# Patient Record
Sex: Male | Born: 1950 | Race: White | Hispanic: No | Marital: Married | Smoking: Former smoker
Health system: Southern US, Community
[De-identification: ages and names within clinical notes are randomized; demographics above are authoritative.]

## PROBLEM LIST (undated history)

## (undated) DIAGNOSIS — I10 Essential (primary) hypertension: Secondary | ICD-10-CM

## (undated) DIAGNOSIS — G8929 Other chronic pain: Secondary | ICD-10-CM

## (undated) DIAGNOSIS — E785 Hyperlipidemia, unspecified: Secondary | ICD-10-CM

## (undated) DIAGNOSIS — M25569 Pain in unspecified knee: Secondary | ICD-10-CM

## (undated) DIAGNOSIS — G4730 Sleep apnea, unspecified: Secondary | ICD-10-CM

## (undated) DIAGNOSIS — R002 Palpitations: Secondary | ICD-10-CM

## (undated) HISTORY — DX: Sleep apnea, unspecified: G47.30

## (undated) HISTORY — DX: Pain in unspecified knee: M25.569

## (undated) HISTORY — DX: Essential (primary) hypertension: I10

## (undated) HISTORY — DX: Other chronic pain: G89.29

## (undated) HISTORY — PX: ANTERIOR CRUCIATE LIGAMENT REPAIR: SHX115

## (undated) HISTORY — DX: Hyperlipidemia, unspecified: E78.5

---

## 2000-06-16 ENCOUNTER — Ambulatory Visit (HOSPITAL_COMMUNITY): Admission: RE | Admit: 2000-06-16 | Discharge: 2000-06-16 | Payer: Self-pay | Admitting: Emergency Medicine

## 2002-08-14 ENCOUNTER — Encounter: Payer: Self-pay | Admitting: Internal Medicine

## 2002-08-14 ENCOUNTER — Ambulatory Visit (HOSPITAL_COMMUNITY): Admission: RE | Admit: 2002-08-14 | Discharge: 2002-08-14 | Payer: Self-pay | Admitting: Internal Medicine

## 2002-08-31 ENCOUNTER — Encounter: Admission: RE | Admit: 2002-08-31 | Discharge: 2002-09-28 | Payer: Self-pay | Admitting: Internal Medicine

## 2003-12-07 ENCOUNTER — Ambulatory Visit (HOSPITAL_COMMUNITY): Admission: RE | Admit: 2003-12-07 | Discharge: 2003-12-07 | Payer: Self-pay | Admitting: Orthopedic Surgery

## 2006-01-01 ENCOUNTER — Ambulatory Visit (HOSPITAL_BASED_OUTPATIENT_CLINIC_OR_DEPARTMENT_OTHER): Admission: RE | Admit: 2006-01-01 | Discharge: 2006-01-01 | Payer: Self-pay | Admitting: Internal Medicine

## 2006-01-03 ENCOUNTER — Ambulatory Visit: Payer: Self-pay | Admitting: Internal Medicine

## 2006-03-30 ENCOUNTER — Ambulatory Visit (HOSPITAL_COMMUNITY): Admission: RE | Admit: 2006-03-30 | Discharge: 2006-03-30 | Payer: Self-pay | Admitting: Gastroenterology

## 2006-04-06 ENCOUNTER — Ambulatory Visit: Payer: Self-pay | Admitting: Internal Medicine

## 2006-05-20 ENCOUNTER — Ambulatory Visit: Payer: Self-pay | Admitting: Internal Medicine

## 2006-07-20 ENCOUNTER — Ambulatory Visit: Payer: Self-pay | Admitting: Internal Medicine

## 2007-08-04 DIAGNOSIS — J449 Chronic obstructive pulmonary disease, unspecified: Secondary | ICD-10-CM

## 2007-08-04 DIAGNOSIS — G4733 Obstructive sleep apnea (adult) (pediatric): Secondary | ICD-10-CM

## 2007-08-04 DIAGNOSIS — J4489 Other specified chronic obstructive pulmonary disease: Secondary | ICD-10-CM | POA: Insufficient documentation

## 2007-08-08 ENCOUNTER — Encounter: Payer: Self-pay | Admitting: Internal Medicine

## 2007-08-08 ENCOUNTER — Ambulatory Visit (HOSPITAL_BASED_OUTPATIENT_CLINIC_OR_DEPARTMENT_OTHER): Admission: RE | Admit: 2007-08-08 | Discharge: 2007-08-08 | Payer: Self-pay | Admitting: Internal Medicine

## 2007-08-14 ENCOUNTER — Ambulatory Visit: Payer: Self-pay | Admitting: Internal Medicine

## 2007-09-07 ENCOUNTER — Telehealth: Payer: Self-pay | Admitting: Internal Medicine

## 2007-09-14 ENCOUNTER — Telehealth: Payer: Self-pay | Admitting: Internal Medicine

## 2008-07-09 ENCOUNTER — Ambulatory Visit: Payer: Self-pay | Admitting: Sports Medicine

## 2008-07-09 DIAGNOSIS — M79609 Pain in unspecified limb: Secondary | ICD-10-CM | POA: Insufficient documentation

## 2009-04-03 ENCOUNTER — Ambulatory Visit: Payer: Self-pay | Admitting: Sports Medicine

## 2009-04-03 DIAGNOSIS — M25559 Pain in unspecified hip: Secondary | ICD-10-CM | POA: Insufficient documentation

## 2009-04-03 DIAGNOSIS — M1710 Unilateral primary osteoarthritis, unspecified knee: Secondary | ICD-10-CM

## 2009-09-08 ENCOUNTER — Emergency Department (HOSPITAL_COMMUNITY): Admission: EM | Admit: 2009-09-08 | Discharge: 2009-09-08 | Payer: Self-pay | Admitting: Emergency Medicine

## 2009-09-08 DIAGNOSIS — I499 Cardiac arrhythmia, unspecified: Secondary | ICD-10-CM | POA: Insufficient documentation

## 2009-09-27 ENCOUNTER — Ambulatory Visit: Payer: Self-pay | Admitting: Cardiology

## 2009-09-27 DIAGNOSIS — E785 Hyperlipidemia, unspecified: Secondary | ICD-10-CM

## 2009-09-27 DIAGNOSIS — I10 Essential (primary) hypertension: Secondary | ICD-10-CM | POA: Insufficient documentation

## 2009-09-29 LAB — CONVERTED CEMR LAB: TSH: 0.54 microintl units/mL (ref 0.35–5.50)

## 2010-01-01 ENCOUNTER — Ambulatory Visit (HOSPITAL_COMMUNITY): Admission: RE | Admit: 2010-01-01 | Discharge: 2010-01-01 | Payer: Self-pay | Admitting: Orthopedic Surgery

## 2010-10-21 NOTE — Assessment & Plan Note (Signed)
Summary: np6/palps/rapid & irregular/jml   Visit Type:  Follow-up Primary Provider:  Dr. Waynard Edwards  CC:  Arrhythmia.  History of Present Illness: The patient is a Teacher, early years/pre at [employer].  He presents for evaluation of palpitations. This happened on December 19. It actually woke him from his sleep. He felt that his heart was racing and that his pulse was very "thready". He was unable to obtain a blood pressure on his home blood pressure cuff. He was unable to count the heart rate. He presented to the emergency room but by the time he got there the arrhythmia had abated. It persisted for 30-45 minutes. Since then he has had no further tachycardia palpitations. With this he did not have presyncope or syncope. He did not have chest pressure, neck or arm discomfort. He had no shortness of breath. He was not having PND or orthopnea. In the ER he ruled out for myocardial infarction. BNP, basic metabolic profile and CBC were normal. Chest x-ray was normal. I reviewed these results.  He is very active. He exercises routinely a rope quickly. With this he has no cardiovascular symptoms. He had a negative exercise treadmill test at age 30.   Current Medications (verified): 1)  Lisinopril 40 Mg  Tabs (Lisinopril) .... Take 1 Tablet By Mouth Once A Day 2)  Zocor 20 Mg  Tabs (Simvastatin) .... Take 1 Tablet By Mouth Once A Day 3)  Doxycycline Hyclate 100 Mg  Tabs (Doxycycline Hyclate) .... Take 1 Tablet By Mouth Once A Day 4)  Aspirin 81 Mg  Tabs (Aspirin) .Marland Kitchen.. 1 By Mouth Daily 5)  Hydrochlorothiazide 12.5 Mg Caps (Hydrochlorothiazide) .Marland Kitchen.. 1 By Mouth Daily 6)  Flonase 50 Mcg/act Susp (Fluticasone Propionate) .... Daily 7)  Fish Oil   Oil (Fish Oil) .Marland Kitchen.. 1 By Mouth Daily 8)  Finacea 15 % Gel (Azelaic Acid) .... Daily  Allergies (verified): No Known Drug Allergies  Past History:  Past Medical History: Chronic Bilateral Knee Pain (L>R) Sleep apnea (treated with a mouthguard) Hypertension x6  years Hyperlipidemia x6 years  Past Surgical History: ACL repair  Family History: The patient's father died at age 74 but he is not clear of the cause. His mother died of ovarian cancer at age 74. He has 2 sisters alive and healthy.  Social History: He he is married and has 2 children. He smoked briefly for about 6 years quitting in 1974.  He does drink 3-4 cups of coffee per day.  Review of Systems       Positive for occasional reflux, joint pains. Otherwise negative for all other systems.  Vital Signs:  Patient profile:   60 year old male Height:      71 inches Weight:      159 pounds BMI:     22.26 Pulse rate:   84 / minute Resp:     16 per minute BP sitting:   122 / 78  (right arm)  Vitals Entered By: Marrion Coy, CNA (September 27, 2009 9:36 AM)  Physical Exam  General:  Well developed, well nourished, in no acute distress. Head:  normocephalic and atraumatic Eyes:  PERRLA/EOM intact; conjunctiva and lids normal. Mouth:  Teeth, gums and palate normal. Oral mucosa normal. Neck:  Neck supple, no JVD. No masses, thyromegaly or abnormal cervical nodes. Chest Wall:  no deformities or breast masses noted Lungs:  Clear bilaterally to auscultation and percussion. Abdomen:  Bowel sounds positive; abdomen soft and non-tender without masses, organomegaly, or hernias noted. No  hepatosplenomegaly. Msk:  Back normal, normal gait. Muscle strength and tone normal. Extremities:  No clubbing or cyanosis. Neurologic:  Alert and oriented x 3. Skin:  Intact without lesions or rashes. Cervical Nodes:  no significant adenopathy Axillary Nodes:  no significant adenopathy Inguinal Nodes:  no significant adenopathy Psych:  Normal affect.   Detailed Cardiovascular Exam  Neck    Carotids: Carotids full and equal bilaterally without bruits.      Neck Veins: Normal, no JVD.    Heart    Inspection: no deformities or lifts noted.      Palpation: normal PMI with no thrills palpable.       Auscultation: regular rate and rhythm, S1, S2 without murmurs, rubs, gallops, or clicks.    Vascular    Abdominal Aorta: no palpable masses, pulsations, or audible bruits.      Femoral Pulses: normal femoral pulses bilaterally.      Pedal Pulses: normal pedal pulses bilaterally.      Radial Pulses: normal radial pulses bilaterally.      Peripheral Circulation: no clubbing, cyanosis, or edema noted with normal capillary refill.     Impression & Recommendations:  Problem # 1:  CARDIAC ARRHYTHMIA (ICD-427.9) The patient had one episode of a sustained arrhythmia. The mechanism of this is not clear. He has a perfectly normal physical exam and EKG. He has had no symptoms since then. I will check a TSH. I do not think further evaluation is warranted. However, we discussed at length mechanisms to try to get a rhythm strip if this ever happens again. If it happens with any frequency he will call me and we will consider monitoring with an event monitor or Holter.  Problem # 2:  ESSENTIAL HYPERTENSION, BENIGN (ICD-401.1) His blood pressure is controlled and he continues to meds as listed.  Other Orders: TLB-TSH (Thyroid Stimulating Hormone) (84443-TSH) EKG w/ Interpretation (93000)  CHF Assessment/Plan:      The patient's current weight is 159 pounds.  His previous weight was 162 pounds.    Patient Instructions: 1)  Your physician recommends that you schedule a follow-up appointment as needed 2)  Your physician recommends that you continue on your current medications as directed. Please refer to the Current Medication list given to you today. 3)  You have been diagnosed with atrial fibrillation.  Atrial fibrillation is a condition in which one of the upper chambers of the heart has extra electrical cells causing it to beat very fast.  Please see the handout/brochure given to you today for further information.

## 2010-11-26 ENCOUNTER — Encounter: Payer: Self-pay | Admitting: Cardiology

## 2010-11-26 ENCOUNTER — Other Ambulatory Visit: Payer: Self-pay | Admitting: Cardiology

## 2010-11-26 ENCOUNTER — Ambulatory Visit (INDEPENDENT_AMBULATORY_CARE_PROVIDER_SITE_OTHER): Payer: 59 | Admitting: Cardiology

## 2010-11-26 ENCOUNTER — Encounter (INDEPENDENT_AMBULATORY_CARE_PROVIDER_SITE_OTHER): Payer: 59

## 2010-11-26 DIAGNOSIS — E785 Hyperlipidemia, unspecified: Secondary | ICD-10-CM

## 2010-11-26 DIAGNOSIS — R9431 Abnormal electrocardiogram [ECG] [EKG]: Secondary | ICD-10-CM | POA: Insufficient documentation

## 2010-11-26 DIAGNOSIS — R002 Palpitations: Secondary | ICD-10-CM

## 2010-11-26 DIAGNOSIS — I499 Cardiac arrhythmia, unspecified: Secondary | ICD-10-CM

## 2010-11-26 LAB — BASIC METABOLIC PANEL
BUN: 18 mg/dL (ref 6–23)
CO2: 29 mEq/L (ref 19–32)
Calcium: 9.3 mg/dL (ref 8.4–10.5)
Chloride: 100 mEq/L (ref 96–112)
Creatinine, Ser: 1 mg/dL (ref 0.4–1.5)
GFR: 80.85 mL/min (ref >60.00)
Glucose, Bld: 96 mg/dL (ref 70–99)
Potassium: 4 mEq/L (ref 3.5–5.1)
Sodium: 141 mEq/L (ref 135–145)

## 2010-12-01 ENCOUNTER — Encounter: Payer: Self-pay | Admitting: *Deleted

## 2010-12-02 NOTE — Assessment & Plan Note (Signed)
Summary: palpations/mt   Visit Type:  Follow-up Primary Provider:  Dr. Waynard Edwards  CC:  palpitations.  History of Present Illness: The patient presents for followup of palpitations. This is different than previous complaints. He feels his heart skipping and thinks he might be having PVCs. However, he has not had this recorded. It is happening daily particularly in the mornings.  He will feel it's getting particularly in the morning.  that he cannot bring these on. He denies any chest pressure, neck or arm discomfort. He denies any shortness of breath, PND or orthopnea. He does exercise routinely and denies any of the symptoms. He did cut back his caffeine but still drinks one cup of coffee in the morning. He doesn't think she's had these symptoms in the past.  Current Medications (verified): 1)  Lisinopril 40 Mg  Tabs (Lisinopril) .... Take 1 Tablet By Mouth Once A Day 2)  Zocor 20 Mg  Tabs (Simvastatin) .... Take 1 Tablet By Mouth Once A Day 3)  Doxycycline Hyclate 100 Mg  Tabs (Doxycycline Hyclate) .... Take 1 Tablet By Mouth Once A Day 4)  Aspirin 81 Mg  Tabs (Aspirin) .Marland Kitchen.. 1 By Mouth Daily 5)  Hydrochlorothiazide 12.5 Mg Caps (Hydrochlorothiazide) .Marland Kitchen.. 1 By Mouth Daily 6)  Flonase 50 Mcg/act Susp (Fluticasone Propionate) .... Daily 7)  Fish Oil   Oil (Fish Oil) .Marland Kitchen.. 1 By Mouth Daily 8)  Finacea 15 % Gel (Azelaic Acid) .... Daily  Allergies (verified): No Known Drug Allergies  Past History:  Past Medical History: Chronic Bilateral Knee Pain (L>R) Sleep apnea (treated with a mouthguard) Hypertension  Hyperlipidemia  Review of Systems       As stated in the HPI and negative for all other systems.   Vital Signs:  Patient profile:   60 year old male Height:      71 inches Weight:      151 pounds BMI:     21.14 Pulse rate:   70 / minute Pulse rhythm:   regular BP sitting:   128 / 74  (right arm) Cuff size:   regular  Vitals Entered By: Judithe Modest CMA (November 26, 2010  11:53 AM)  Physical Exam  General:  Well developed, well nourished, in no acute distress. Head:  normocephalic and atraumatic Neck:  Neck supple, no JVD. No masses, thyromegaly or abnormal cervical nodes. Chest Wall:  no deformities or breast masses noted Lungs:  Clear bilaterally to auscultation and percussion. Abdomen:  Bowel sounds positive; abdomen soft and non-tender without masses, organomegaly, or hernias noted. No hepatosplenomegaly. Msk:  Back normal, normal gait. Muscle strength and tone normal. Extremities:  No clubbing or cyanosis. Neurologic:  Alert and oriented x 3. Skin:  Intact without lesions or rashes. Cervical Nodes:  no significant adenopathy Inguinal Nodes:  no significant adenopathy Psych:  Normal affect.   Detailed Cardiovascular Exam  Neck    Carotids: Carotids full and equal bilaterally without bruits.      Neck Veins: Normal, no JVD.    Heart    Inspection: no deformities or lifts noted.      Palpation: normal PMI with no thrills palpable.      Auscultation: regular rate and rhythm, S1, S2 without murmurs, rubs, gallops, or clicks.    Vascular    Abdominal Aorta: no palpable masses, pulsations, or audible bruits.      Femoral Pulses: normal femoral pulses bilaterally.      Pedal Pulses: normal pedal pulses bilaterally.  Radial Pulses: normal radial pulses bilaterally.      Peripheral Circulation: no clubbing, cyanosis, or edema noted with normal capillary refill.     EKG  Procedure date:  11/26/2010  Findings:      Sinus rhythm, rate 70, axis within normal limits, intervals within normal limits, possible left atrial enlargement  Impression & Recommendations:  Problem # 1:  PALPITATIONS (ICD-785.1) I will check electrolytes including magnesium and TSH. I will apply a 24-hour monitor.  He will have an echo. This will need to evaluate the possible left atrial enlargement on EKG  Orders: Echocardiogram (Echo)  Problem # 2:  OBSTRUCTIVE  SLEEP APNEA (ICD-327.23) I will be looking at the echo to see if he has had any evidence of increased pulmonary or RV pressures.  This could be related (though unlikely) to ectopy.  Problem # 3:  ESSENTIAL HYPERTENSION, BENIGN (ICD-401.1) His blood pressure is controlled and he will continue the meds as listed.  Other Orders: TLB-BMP (Basic Metabolic Panel-BMET) (80048-METABOL) TLB-Magnesium (Mg) (83735-MG) TLB-TSH (Thyroid Stimulating Hormone) (84443-TSH) Holter Monitor (Holter Monitor)  Patient Instructions: 1)  Your physician recommends that you schedule a follow-up appointment in: 3 months 2)  Your physician has recommended that you wear a holter monitor.  Holter monitors are medical devices that record the heart's electrical activity. Doctors most often use these monitors to diagnose arrhythmias. Arrhythmias are problems with the speed or rhythm of the heartbeat. The monitor is a small, portable device. You can wear one while you do your normal daily activities. This is usually used to diagnose what is causing palpitations/syncope (passing out). 24 hours holter monitor 3)  Your physician recommends that you return for lab work in: Today BMET, Magnesium TSH.

## 2010-12-04 ENCOUNTER — Ambulatory Visit (HOSPITAL_COMMUNITY): Payer: 59 | Attending: Cardiology

## 2010-12-04 DIAGNOSIS — G4730 Sleep apnea, unspecified: Secondary | ICD-10-CM | POA: Insufficient documentation

## 2010-12-04 DIAGNOSIS — I10 Essential (primary) hypertension: Secondary | ICD-10-CM | POA: Insufficient documentation

## 2010-12-04 DIAGNOSIS — R002 Palpitations: Secondary | ICD-10-CM | POA: Insufficient documentation

## 2010-12-04 DIAGNOSIS — I359 Nonrheumatic aortic valve disorder, unspecified: Secondary | ICD-10-CM | POA: Insufficient documentation

## 2010-12-04 DIAGNOSIS — E785 Hyperlipidemia, unspecified: Secondary | ICD-10-CM | POA: Insufficient documentation

## 2010-12-08 ENCOUNTER — Telehealth: Payer: Self-pay | Admitting: Cardiology

## 2010-12-09 ENCOUNTER — Telehealth: Payer: Self-pay | Admitting: *Deleted

## 2010-12-09 DIAGNOSIS — R002 Palpitations: Secondary | ICD-10-CM

## 2010-12-09 NOTE — Telephone Encounter (Signed)
Called to give pt results of his monitor which demonstrated PVC's with a brief run of bigeminy - Dr Antoine Poche has ordered for the pt to start Metoprolol 12.5 mg po bid.  Left message for pt to call back for results and orders.

## 2010-12-10 MED ORDER — METOPROLOL TARTRATE 25 MG PO TABS: 12.5000 mg | ORAL_TABLET | Freq: Two times a day (BID) | ORAL | Status: DC

## 2010-12-10 NOTE — Telephone Encounter (Signed)
Pt aware of need to start Metoprolol 25 mg 1/2 tablet bid for palps.  He will follow up as ordered and call back if problems or needs prior to then.

## 2010-12-18 NOTE — Progress Notes (Signed)
Summary: Questions about monitor results  left message   Phone Note Call from Patient Call back at Work Phone [phone] Call back at [phone]   Caller: Patient Summary of Call: Questions about echo reuatls Initial call taken by: Judie Grieve,  December 08, 2010 4:48 PM  Follow-up for Phone Call        Still having palps and wants to know if monitor results are normal.  I do not have the results of the monitor back at this time but pt is aware I will follow up with Windell Moulding in the am and call him back. Follow-up by: Charolotte Capuchin, RN,  December 08, 2010 5:59 PM  Additional Follow-up for Phone Call Additional follow up Details #1::        PVCs.  We can try a low dose of beta blocker.  Metoprolol 12.5 mg two times a day.  Dispense number 90.  4 refills. Additional Follow-up by: Rollene Rotunda, MD, [facility],  December 09, 2010 6:20 PM    Additional Follow-up for Phone Call Additional follow up Details #2::    left messge for pt to call the office for the monitor results and new orders  Avie Arenas, RN  Pt aware of results, RX sent into [facility] pharmacy Follow-up by: Charolotte Capuchin, RN,  December 10, 2010 3:47 PM

## 2010-12-22 LAB — CBC
Hemoglobin: 16 g/dL (ref 13.0–17.0)
MCV: 92 fL (ref 78.0–100.0)
RBC: 5 MIL/uL (ref 4.22–5.81)
WBC: 6.3 10*3/uL (ref 4.0–10.5)

## 2010-12-22 LAB — BASIC METABOLIC PANEL
CO2: 28 mEq/L (ref 19–32)
Chloride: 104 mEq/L (ref 96–112)
Glucose, Bld: 131 mg/dL — ABNORMAL HIGH (ref 70–99)
Potassium: 4.2 mEq/L (ref 3.5–5.1)
Sodium: 139 mEq/L (ref 135–145)

## 2010-12-22 LAB — POCT CARDIAC MARKERS
CKMB, poc: 1 ng/mL (ref 1.0–8.0)
CKMB, poc: <1 ng/mL — ABNORMAL LOW (ref 1.0–8.0)
Myoglobin, poc: 105 ng/mL (ref 12–200)
Myoglobin, poc: 97.3 ng/mL (ref 12–200)
Troponin i, poc: <0.05 ng/mL (ref 0.00–0.09)

## 2010-12-22 LAB — DIFFERENTIAL
Eosinophils Absolute: 0.5 10*3/uL (ref 0.0–0.7)
Lymphs Abs: 1.4 10*3/uL (ref 0.7–4.0)
Monocytes Relative: 9 % (ref 3–12)
Neutro Abs: 3.7 10*3/uL (ref 1.7–7.7)
Neutrophils Relative %: 60 % (ref 43–77)

## 2010-12-23 ENCOUNTER — Encounter: Payer: Self-pay | Admitting: Family Medicine

## 2010-12-23 ENCOUNTER — Ambulatory Visit (INDEPENDENT_AMBULATORY_CARE_PROVIDER_SITE_OTHER): Payer: 59 | Admitting: Family Medicine

## 2010-12-23 VITALS — BP 122/75 | HR 58 | Ht 71.0 in | Wt 160.0 lb

## 2010-12-23 DIAGNOSIS — M79675 Pain in left toe(s): Secondary | ICD-10-CM

## 2010-12-23 DIAGNOSIS — M79609 Pain in unspecified limb: Secondary | ICD-10-CM

## 2010-12-24 DIAGNOSIS — M79675 Pain in left toe(s): Secondary | ICD-10-CM | POA: Insufficient documentation

## 2010-12-24 NOTE — Assessment & Plan Note (Signed)
-   Lt great toe pain - may have component of DJD vs hyperextension injury from running - Mild spurring noted at MTP on MSK u/s - Offered post-op shoe for added support, but pt declined b/c pain is improving.  Recommended firm supportive shoe. - Cont. OTC NSAIDs for next 1-2 weeks - Ice bath at end of the day - Ok to continue elliptical & stationary bike, would limit treadmill for next week and progress as pain allows - Follow-up as needed

## 2010-12-24 NOTE — Progress Notes (Signed)
  Subjective:    Patient ID: Gary [name], male    DOB: 1951-08-14, 60 y.o.   MRN: [id number]  HPI 60yo male to office with c/o L great toe pain x 5-days.  Pain mainly along dorsal-medial aspect of 1st MTP & feels about 60-70% improved compared to when it started.  Denies any injury or trauma.  Does workout regularly with elliptical, treadmill, & stationary bike.  Noted mild amount of swelling, no redness or bruising.  Was at beach over the weekend & had increased pain walking on beach barefoot.  Pain worse with extension of the great toe.  No history of gout.  Using aleve & ibuprofen intermittently which have been helpful.  Has avoided treadmill over past few days.  Denies previous injury to foot or toes.  Denies numbness/tingling.   Review of Systems Per HPI, otherwise negative    Objective:   Physical Exam GEN: AOx3, NAD, pleasant SKIN: no rashes or lesions MSK: - Feet:  Moderate arch b/l, no transverse arch collapse.  Minimal swelling over left 1st MTP, no redness or bruising.  Normal left 1st toe mobility, mild pain with dorsiflexion, no pain with plantar-flexion.  No tenderness over sesamoids, MTs, or tarsals.  No tenderness over plantar fascia.  Rt foot with normal ROM, no swelling, redness, tenderness, or weakness. - Ankles: FROM without pain, swelling, tenderness, weakness, or instability b/l - Gait: walking without a limp NEURO: sensation intact to light touch VASC: pulses +2/4 DP & PT b/l, normal cap refill  MSK U/S:  Left foot- mild spurring noted along distal MT of great toe at 1st MTP, minimal amount of surrounding hypoechoic fluid, no signs of fracture.  Normal appearing MT shafts & normal appearing 2nd-5th MTP joints.  Images saved.       Assessment & Plan:

## 2011-02-03 NOTE — Procedures (Signed)
NAME:  [name], [name]               ACCOUNT NO.:  [id number]   MEDICAL RECORD NO.:  [id number]          PATIENT TYPE:  OUT   LOCATION:  SLEEP CENTER                 FACILITY:  [facility]   PHYSICIAN:  Clinton D. Maple Hudson, MD, FCCP, FACPDATE OF BIRTH:  01-17-50   DATE OF STUDY:  08/08/2007                            NOCTURNAL POLYSOMNOGRAM   REFERRING PHYSICIAN:  Clinton D. Young, MD, FCCP, FACP   INDICATION FOR STUDY:  Hypersomnia with sleep apnea.   EPWORTH SLEEPINESS SCORE:  10/24.  BMI 21.  Weight 155 pounds.  Height  71 inches.   HOME MEDICATIONS:  Listed and reviewed.   The patient was diagnosed with obstructive sleep apnea on a study 2  years ago but could not tolerate CPAP.  The present study is to assess  effectiveness of an oral appliance.   SLEEP ARCHITECTURE:  Total sleep time 408 minutes with sleep efficiency  94%.  Stage I was 3%, stage II 84%, stage III 7%, REM 4.8% of total  sleep time.  Sleep latency 1 minute.  REM latency 168 minutes.  Awake  after sleep onset 21 minutes.  Arousal index 6.8.  No bedtime medication  was taken.  He did wear his oral appliance.   RESPIRATORY DATA:  Apnea/hypopnea index (AHI/RDI) 12.2 obstructive  events per hour indicating moderate obstructive sleep apnea/hypopnea  syndrome.  There were 5 obstructive apneas and 129 hypopneas.  Most  events were while sleeping supine.  REM AHI 6.2.   OXYGEN DATA:  Very mild snoring with oxygen desaturation to a nadir of  83%.  Mean oxygen saturation through the study was normal at 92.9% on  room air.   CARDIAC DATA:  Normal sinus rhythm.   MOVEMENT-PARASOMNIA:  No significant movement disturbance.  No bathroom  trips.   IMPRESSIONS-RECOMMENDATIONS:  1. Moderate obstructive sleep apnea/hypopnea syndrome.  Apnea/hypopnea      index 21.2 per hour while wearing oral appliance.  The events were      significantly more common while supine (apnea/hypopnea index 21.9      per hour while supine) and in REM.   Mild snoring with oxygen      desaturation to a nadir of 83%.  2. Control is not optimal, desired normal range would be an      apnea/hypopnea index less than 5 obstructive events per hour,      however, most of the events were relatively mild hypopneas much      more common while sleeping on      back.  Encouraged to sleep off flat back may be sufficient to the      oral appliance.  Alternative therapies could be considered as      clinically appropriate.      Clinton D. Maple Hudson, MD, [facility], FACP  Diplomate, Biomedical engineer of Sleep Medicine  Electronically Signed     CDY/MEDQ  D:  08/14/2007 13:46:05  T:  08/14/2007 19:34:39  Job:  [id number]

## 2011-02-06 NOTE — Procedures (Signed)
NAME:  Gary [name], Gary [name]               ACCOUNT NO.:  [id number]   MEDICAL RECORD NO.:  [id number]          PATIENT TYPE:  OUT   LOCATION:  SLEEP CENTER                 FACILITY:  [facility]   PHYSICIAN:  Clinton D. Maple Hudson, M.D. DATE OF BIRTH:  May 01, 1951   DATE OF STUDY:                              NOCTURNAL POLYSOMNOGRAM   REFERRING PHYSICIAN:  Dr. Loraine Leriche A. Perini.   INDICATIONS FOR STUDY:  Insomnia with sleep apnea.   EPWORTH SLEEPINESS SCORE:  16/24, BMI 24.  Weight 172 pounds.   MEDICATION:  Aspirin, lisinopril, Zocor, doxycycline.   SLEEP ARCHITECTURE:  Total sleep time 358 minutes with sleep efficiency 89%.  Stage I was 4%, stage II 71%, stages III and IV are 7%, REM 19% of total  sleep time.  Sleep latency 11 minutes, REM latency 122 minutes, awake after  sleep onset 36 minutes, arousal index 27.21 indicating mildly increased  sleep fragmentation.  Bedtime medication was not reported.   RESPIRATORY DATA:  Split study protocol.  Apnea/hypopnea index (AHI, RDI) 50  obstructive events per hour indicating moderately severe obstructive sleep  apnea/hypopnea syndrome before CPAP.  This included 40 obstructive apneas,  13 central apneas, 8 mixed apneas and 42 hypopnea's before CPAP.  Most sleep  and therefore most events were while supine.  Some events were noted while  sleeping on right side.  REM AHI 1.8 per hour.  CPAP was titrated to 8 CWP.  Optimum pressure appears to be 6 CWP associated with an AHI of 0 per hour.  There was some breakthrough snoring above that but higher pressures seemed  associated with progressive nasal congestion and increasingly frequent  obstructive events.  Suggest initial trial at 6 CWP. A medium ComfortGel  mask was used with a heated humidifier.  He indicated it caused drying of  the eyes and nose.   OXYGEN DATA:  Moderate snoring with oxygen desaturation to a nadir of 72%.  Mean oxygen saturation after CPAP control was 94-96% on room air.   CARDIAC DATA:   Sinus bradycardia 49-45 beats per minute.   MOVEMENT/PARASOMNIA:  Occasional leg jerk with insignificant effect on  sleep.  Bathroom x1.   IMPRESSION/RECOMMENDATION:  1.  Moderately severe obstructive sleep apnea/hypopnea syndrome, AHI 50 per      hour with most events while supine, moderate snoring and oxygen      desaturation to 72%.  2.  CPAP titration to a suggested initial trial pressure of 6 CWP, AHI 0 per      hour.  Higher pressures appeared      associated with progressive obstruction from reflex nasal congestion.  A      medium ComfortGel mask was used with heated humidifier.      Clinton D. Maple Hudson, M.D.  Diplomate, Biomedical engineer of Sleep Medicine  Electronically Signed     CDY/MEDQ  D:  01/03/2006 16:42:34  T:  01/04/2006 10:10:08  Job:  [id number]

## 2011-02-06 NOTE — Assessment & Plan Note (Signed)
[facility]                               PULMONARY OFFICE NOTE   NAME:[name], Gary ELENES                      MRN:          [id number]  DATE:07/20/2006                            DOB:          1951-05-08    PULMONARY/SLEEP FOLLOWUP:   PROBLEM LIST:  Obstructive sleep apnea.   HISTORY:  He actively dislikes CPAP in spite of a lot of effort between him  and the home care company ([employer]).  He says he gets less sleep with it  than without and cannot make it comfortable.  We reviewed these issues  again.  I talked with him some more about alternative therapies.   OBJECTIVE:  VITAL SIGNS:  Weight 179 pounds, BP 116/72, pulse regular 52,  room air saturation 97%.  GENERAL:  Trim alert man.  HEENT:  Long palate.  Nasal airway is not obstructed.  Breathing is  unlabored.   IMPRESSION:  Obstructive sleep apnea, unsuccessful with CPAP.   PLAN:  1. [facility] is to stop CPAP.  2. We are scheduling ENT evaluation with Dr. Annalee Genta.  3. Pending that evaluation, I will see him back p.r.n.  He is reminded to      sleep off the flat of his back and that it his responsibility to be      alert while driving.     Clinton D. Maple Hudson, MD, [facility], FACP  Electronically Signed    CDY/MedQ  DD: 07/20/2006  DT: 07/21/2006  Job #: [id number]   cc:   Loraine Leriche A. Perini, M.D.  Kinnie Scales. Annalee Genta, M.D.

## 2011-02-06 NOTE — Assessment & Plan Note (Signed)
[facility]                               PULMONARY OFFICE NOTE   NAME:Cumberland, ALDRED MASE                      MRN:          [id number]  DATE:05/20/2006                            DOB:          02/26/1951    Problem #1:  Obstructive sleep apnea.   HISTORY:  He has been slow to adjust to CPAP which seems to smother him at 6  CWP.  He is not sure he sees any benefit yet although his wife tells him he  does not snore when he wears it.  He has only just started using the  humidifier after the home care company told him that he would not need it  until winter and that has improved his comfort.  He started with nasal  pillows and has switched to a nasal mask.  We discussed comfort issues,  goals and measuring criteria.   MEDICATIONS:  1. Lisinopril 40 mg.  2. Aspirin 81 mg.  3. Zocor 20 mg.  4. Doxicycline 100 mg.  5. CPAP 6 CWP.   NO MEDICATION ALLERGIES.   OBJECTIVE:  Weight 175 pounds, BP 128/84, pulse regular, 59, room air  saturation 96%.  He is trim and alert.  There is no evident nasal congestion  and no pressure marks around his face from the mask.   IMPRESSION:  The initially identified CPAP pressure of 6 may be low given  his apnea hypopnea index was 50 per hour, although he is a trim individual.  We are going to retitrate for pressure check.   PLAN:  CPAP titration for best pressure check.  We are scheduling return in  2 months but will be in touch after the reassessment.  Meanwhile, we have  reviewed comfort measures and his wife is helping him to maintain some  reassurance that there are tangible improvements evident.                                   Clinton D. Maple Hudson, MD, FCCP, FACP   CDY/MedQ  DD:  05/20/2006  DT:  05/21/2006  Job #:  [id number]   cc:   Loraine Leriche A. Perini, MD

## 2011-02-06 NOTE — Assessment & Plan Note (Signed)
[facility]                               PULMONARY OFFICE NOTE   NAME:Gary [name], Gary [name]                      MRN:          [id number]  DATE:04/06/2006                            DOB:          1951/06/03    PROBLEM:  Gary [name] is a 60 year old man seen in consultation at the  kind request of Dr. Waynard Edwards because of obstructive sleep apnea.   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  He has not been aware that he stopped  breathing but wife has been suspicious.  She reports that he switches side  all night long in his sleep without specific kicking or jerking.  Apparently, he breathes better on his sides but his shoulders are beginning  to hurt if he lies in the lateral position for long.  A nocturnal  polysomnogram on January 01, 2006 demonstrated moderately severe obstructive  apnea with an AHI of 50 per hour, mostly while supine on this night, with  moderate snoring and oxygen desaturation to 72%.  CPAP was titrated to 6CWP  with an index of 0 per hour.  Higher pressures lost some control apparently  due to a vasomotor nasal congestion.  He is not enthusiastic about trying  CPAP but is willing.  He reports bedtime between 10:00 and 11:00 p.m. with  sleep onset within 10 minutes.  He is up 4-5 times a night for the bathroom  and then right back to sleep.  He is in the habit of getting up at 4:30 a.m.  to go to the gym and then says that he is quite tired by 8:00 p.m. in the  evening.  He thinks that he has gradually gained about 10 pounds in recent  years.  He does not feel that he is significantly sleepy through the day  while at work or while driving.   REVIEW OF SYSTEMS:  Snoring with apneas, some daytime sleepiness if  inactive, choking or gasping rarely wakes him and he is aware of reflux  occasionally aggravated by chocolate or red wine and largely prevented by  over the counter omeprazole.  No headaches, syncope, palpitations or chest  pain.   PAST MEDICAL HISTORY:  1.  Mild seasonal rhinitis with no ENT surgery.  2.  Thyroid function is tested normal.  3.  Hypertension.  4.  Elevated cholesterol.   PAST SURGICAL HISTORY:  Only for knee surgery.   SOCIAL HISTORY:  Quit smoking in 1972.  Alcohol 2-3 times a week and 3-4  cups of regular coffee daily.  Married with children.  Works as a Teacher, early years/pre  at [employer].   FAMILY HISTORY:  Nobody recognized with sleep problems.  Both parents have  died, mother of cancer and father of heart disease.   MEDICATIONS:  1.  Lisinopril 40 mg.  2.  Aspirin.  3.  Zocor 20 mg.  4.  Doxycycline 100 mg.  5.  Fish oil.  6.  Topical therapy for rosacea.   ALLERGIES:  NO MEDICATION ALLERGIES.   OBJECTIVE:  VITAL SIGNS:  Weight 174 pounds, BP 132/74, pulse regular and  56, room air saturation 97%.  GENERAL:  Trim alert man in no distress.  HEENT:  Palate length 2/4.  Speech quality normal.  Nose is not obstructed.  There is some white mucoid postnasal drip with no stridor or thyromegaly.  CARDIAC:  Heart sounds are regular without murmur or gallop.  PULMONARY:  Lung fields are clear and quiet.  NEURO:  No tremor or restlessness.   IMPRESSION:  Obstructive sleep apnea, AHI 50 per hour with daytime  hypersomnia aggravated by insufficient sleep.   PLAN:  1.  We discussed good sleep hygiene and adequate nighttime sleep schedule.  2.  We reviewed the physiology, medical concerns and available treatments      for sleep apnea.  3.  He agrees to a home trial of CPAP at [facility] and recognizes responsibility      to drive safely.  4.  Schedule return in one month, earlier p.r.n.   I appreciate the change to meet Mr. Gary [name].                                   Clinton D. Maple Hudson, MD, FCCP, FACP   CDY/MedQ  DD:  04/07/2006  DT:   04/08/2006  Job #:  [id number]   cc:   Loraine Leriche A. Waynard Edwards, MD  [facility]

## 2011-02-27 ENCOUNTER — Ambulatory Visit: Payer: 59 | Admitting: Cardiology

## 2011-03-24 ENCOUNTER — Encounter: Payer: Self-pay | Admitting: Cardiology

## 2011-03-26 ENCOUNTER — Encounter: Payer: Self-pay | Admitting: Cardiology

## 2011-03-26 ENCOUNTER — Ambulatory Visit (INDEPENDENT_AMBULATORY_CARE_PROVIDER_SITE_OTHER): Payer: 59 | Admitting: Cardiology

## 2011-03-26 VITALS — BP 113/67 | HR 53 | Resp 16 | Ht 71.0 in | Wt 156.0 lb

## 2011-03-26 DIAGNOSIS — I10 Essential (primary) hypertension: Secondary | ICD-10-CM

## 2011-03-26 DIAGNOSIS — R002 Palpitations: Secondary | ICD-10-CM

## 2011-03-26 DIAGNOSIS — I493 Ventricular premature depolarization: Secondary | ICD-10-CM

## 2011-03-26 NOTE — Assessment & Plan Note (Signed)
He tolerates the beta blocker.  No further testing is indicated.  No change in therapy is indicated.

## 2011-03-26 NOTE — Assessment & Plan Note (Signed)
The blood pressure is at target. No change in medications is indicated. We will continue with therapeutic lifestyle changes (TLC).  

## 2011-03-26 NOTE — Progress Notes (Signed)
HPI The patient presents for followup of PVCs. Since I last saw him he did increase his metoprolol to 25 mg b.i.d. As he was having more palpitations. This seems to have helped.  He has not had any significant palpitations. He denies any chest pressure, neck or arm discomfort. He said no new shortness of breath, PND orthopnea. He said no presyncope or syncope.  No Known Allergies  Current Outpatient Prescriptions  Medication Sig Dispense Refill  . aspirin 81 MG tablet Take 81 mg by mouth daily.        . Azelaic Acid (FINACEA) 15 % cream daily.        Marland Kitchen doxycycline (VIBRA-TABS) 100 MG tablet Take 100 mg by mouth daily.        . Fish Oil OIL Take 1 capsule by mouth daily.        . fluticasone (FLONASE) 50 MCG/ACT nasal spray daily.        . hydrochlorothiazide (,MICROZIDE/HYDRODIURIL,) 12.5 MG capsule Take 12.5 mg by mouth daily.        Marland Kitchen lisinopril (PRINIVIL,ZESTRIL) 40 MG tablet Take 40 mg by mouth daily.        . metoprolol tartrate (LOPRESSOR) 25 MG tablet Take 1 tablet (25 mg total) by mouth 2 (two) times daily.  60 tablet  11  . simvastatin (ZOCOR) 20 MG tablet Take 20 mg by mouth daily.          Past Medical History  Diagnosis Date  . Chronic knee pain     bilateral   . Sleep apnea     treated with a mouthguard   . Hypertension   . Hyperlipidemia     Past Surgical History  Procedure Date  . Anterior cruciate ligament repair     ROS:  As stated in the HPI and negative for all other systems.  PHYSICAL EXAM BP 113/67  Pulse 53  Resp 16  Ht 5\' 11"  (1.803 m)  Wt 156 lb (70.761 kg)  BMI 21.76 kg/m2 GENERAL:  Well appearing HEENT:  Pupils equal round and reactive, fundi not visualized, oral mucosa unremarkable NECK:  No jugular venous distention, waveform within normal limits, carotid upstroke brisk and symmetric, no bruits, no thyromegaly LYMPHATICS:  No cervical, inguinal adenopathy LUNGS:  Clear to auscultation bilaterally BACK:  No CVA tenderness CHEST:   Unremarkable HEART:  PMI not displaced or sustained,S1 and S2 within normal limits, no S3, no S4, no clicks, no rubs, no murmurs ABD:  Flat, positive bowel sounds normal in frequency in pitch, no bruits, no rebound, no guarding, no midline pulsatile mass, no hepatomegaly, no splenomegaly EXT:  2 plus pulses throughout, no edema, no cyanosis no clubbing SKIN:  No rashes no nodules NEURO:  Cranial nerves II through XII grossly intact, motor grossly intact throughout PSYCH:  Cognitively intact, oriented to person place and time   EKG:  Sinus rhythm, rate 53, axis within normal limits, intervals within normal limits, no acute ST-T wave changes.   ASSESSMENT AND PLAN

## 2011-03-26 NOTE — Patient Instructions (Signed)
Your physician recommends that you continue on your current medications as directed. Please refer to the Current Medication list given to you today.   Your physician recommends that you schedule a follow-up appointment as needed  

## 2011-11-30 ENCOUNTER — Other Ambulatory Visit: Payer: Self-pay | Admitting: Cardiology

## 2012-06-27 ENCOUNTER — Encounter: Payer: Self-pay | Admitting: Cardiology

## 2012-06-28 ENCOUNTER — Emergency Department (HOSPITAL_COMMUNITY): Payer: 59

## 2012-06-28 ENCOUNTER — Observation Stay (HOSPITAL_COMMUNITY): Payer: 59

## 2012-06-28 ENCOUNTER — Observation Stay (HOSPITAL_COMMUNITY)
Admission: EM | Admit: 2012-06-28 | Discharge: 2012-06-28 | Disposition: A | Discharge status: Home / Self Care | Payer: 59 | Attending: Emergency Medicine | Admitting: Emergency Medicine

## 2012-06-28 ENCOUNTER — Encounter (HOSPITAL_COMMUNITY): Payer: Self-pay | Admitting: Physical Medicine and Rehabilitation

## 2012-06-28 DIAGNOSIS — R079 Chest pain, unspecified: Principal | ICD-10-CM | POA: Insufficient documentation

## 2012-06-28 DIAGNOSIS — E785 Hyperlipidemia, unspecified: Secondary | ICD-10-CM | POA: Insufficient documentation

## 2012-06-28 DIAGNOSIS — R002 Palpitations: Secondary | ICD-10-CM | POA: Insufficient documentation

## 2012-06-28 DIAGNOSIS — F411 Generalized anxiety disorder: Secondary | ICD-10-CM | POA: Insufficient documentation

## 2012-06-28 DIAGNOSIS — I10 Essential (primary) hypertension: Secondary | ICD-10-CM | POA: Insufficient documentation

## 2012-06-28 DIAGNOSIS — R55 Syncope and collapse: Secondary | ICD-10-CM

## 2012-06-28 DIAGNOSIS — F419 Anxiety disorder, unspecified: Secondary | ICD-10-CM

## 2012-06-28 HISTORY — DX: Palpitations: R00.2

## 2012-06-28 LAB — COMPREHENSIVE METABOLIC PANEL
ALT: 30 U/L (ref 0–53)
Albumin: 4.1 g/dL (ref 3.5–5.2)
Alkaline Phosphatase: 60 U/L (ref 39–117)
BUN: 22 mg/dL (ref 6–23)
Chloride: 101 mEq/L (ref 96–112)
Glucose, Bld: 123 mg/dL — ABNORMAL HIGH (ref 70–99)
Potassium: 3.6 mEq/L (ref 3.5–5.1)
Sodium: 140 mEq/L (ref 135–145)
Total Bilirubin: 0.5 mg/dL (ref 0.3–1.2)
Total Protein: 7.1 g/dL (ref 6.0–8.3)

## 2012-06-28 LAB — CBC WITH DIFFERENTIAL/PLATELET
Basophils Relative: 1 % (ref 0–1)
Hemoglobin: 14.8 g/dL (ref 13.0–17.0)
Lymphs Abs: 1.8 10*3/uL (ref 0.7–4.0)
Monocytes Relative: 7 % (ref 3–12)
Neutro Abs: 3.6 10*3/uL (ref 1.7–7.7)
Neutrophils Relative %: 56 % (ref 43–77)
Platelets: 230 10*3/uL (ref 150–400)
RBC: 4.7 MIL/uL (ref 4.22–5.81)

## 2012-06-28 LAB — MAGNESIUM: Magnesium: 2.2 mg/dL (ref 1.5–2.5)

## 2012-06-28 LAB — POCT I-STAT TROPONIN I: Troponin i, poc: 0 ng/mL (ref 0.00–0.08)

## 2012-06-28 LAB — URINALYSIS, ROUTINE W REFLEX MICROSCOPIC
Bilirubin Urine: NEGATIVE
Glucose, UA: NEGATIVE mg/dL
Hgb urine dipstick: NEGATIVE
Specific Gravity, Urine: 1.014 (ref 1.005–1.030)
Urobilinogen, UA: 0.2 mg/dL (ref 0.0–1.0)
pH: 5.5 (ref 5.0–8.0)

## 2012-06-28 LAB — TSH: TSH: 0.891 u[IU]/mL (ref 0.350–4.500)

## 2012-06-28 MED ORDER — NITROGLYCERIN 0.4 MG SL SUBL
0.4000 mg | SUBLINGUAL_TABLET | Freq: Once | SUBLINGUAL | Status: AC
  Administered 2012-06-28: 0.4 mg via SUBLINGUAL

## 2012-06-28 MED ORDER — IOHEXOL 350 MG/ML SOLN
80.0000 mL | Freq: Once | INTRAVENOUS | Status: AC | PRN
  Administered 2012-06-28: 80 mL via INTRAVENOUS

## 2012-06-28 NOTE — ED Notes (Signed)
Pt presents to department for evaluation of sudden onset dizziness and diffuse chest tightness. 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 Pt states he felt like he was going to pass out, but did not. Denies SOB. 2/10 chest tightness at the time. Pt is conscious alert and oriented x4.

## 2012-06-28 NOTE — ED Notes (Signed)
Pt in room from XR

## 2012-06-28 NOTE — ED Notes (Signed)
Cardiologist will review test results and possibly discharge patient home. Instructed to stay in ED for plan of care per Cardiologist.

## 2012-06-28 NOTE — ED Notes (Signed)
Patient transported to CT 

## 2012-06-28 NOTE — ED Notes (Signed)
Patient transported to X-ray pt to go to  Pod A-6

## 2012-06-28 NOTE — Consult Note (Signed)
Cardiology Consult Note   Patient ID: Gary [name] MRN: [id number], DOB/AGE: 05-24-1951   Admit date: 06/28/2012 Date of Consult: 06/28/2012  Primary Physician: Ezequiel Kayser, MD Primary Cardiologist: Rollene Rotunda, MD  Reason for consult: evaluation/management of chest pain, presyncope  HPI: Mr. Gary [name] is a 61yo male with PMHx significant for HTN, HL, symptomatic PVCs and OSA who presents to [facility] ED with chest pain and presyncope.  He has followed Dr. Antoine Poche since 2011 for palpitations attributed to PVCs/bigeminy. He was prescribed metoprolol with much improvement. Mg, TSH WNL at that time. BP at target.   He reports experiencing sudden onset lightheadedness at 11 AM while seated at his desk today. No association with sudden standing or standing for long periods. He had eaten breakfast that morning. He denies LOC. He reports subsequently experiencing 2/10 substernal constant chest tightness without radiation. He notes increased frequency of "skipped beats" today from baseline. He denies shortness of breath, diaphoresis, n/v, PND, DOE or weight increase. He does note mild bilateral ankle swelling at baseline. No prior episodes of chest pain or lightheadedness. He does note increased stress at work recently. He is fairly active working out 5 days/week. There has been no limitation to this. He tells me his prior echo from last year was performed and "normal." He denies tob or elicit drug use. Drinks EtOH occasionally. Reports LDL < 100. Father died suddenly in 88s believed to be from a cardiac cause. The pain continued thus prompting ED presentation.   In the ED, EKG reveals no evidence of ischemia or arrhythmia. Trop-I WNL. BMET and CBC unremarkable. CXR without acute cardiopulmonary abnormalities.   Problem List: Past Medical History  Diagnosis Date  . Chronic knee pain     bilateral   . Sleep apnea     treated with a mouthguard   . Hypertension   . Hyperlipidemia     Past  Surgical History  Procedure Date  . Anterior cruciate ligament repair      Allergies: No Known Allergies  Home Medications: Prior to Admission medications   Medication Sig Start Date End Date Taking? Authorizing Provider  aspirin 81 MG tablet Take 81 mg by mouth daily.      Historical Provider, MD  Azelaic Acid (FINACEA) 15 % cream daily.      Historical Provider, MD  doxycycline (VIBRA-TABS) 100 MG tablet Take 100 mg by mouth daily.      Historical Provider, MD  Fish Oil OIL Take 1 capsule by mouth daily.      Historical Provider, MD  fluticasone (FLONASE) 50 MCG/ACT nasal spray daily.      Historical Provider, MD  hydrochlorothiazide (,MICROZIDE/HYDRODIURIL,) 12.5 MG capsule Take 12.5 mg by mouth daily.      Historical Provider, MD  lisinopril (PRINIVIL,ZESTRIL) 40 MG tablet Take 40 mg by mouth daily.      Historical Provider, MD  metoprolol tartrate (LOPRESSOR) 25 MG tablet TAKE 1 TABLET BY MOUTH TWICE DAILY 11/30/11   Rollene Rotunda, MD  simvastatin (ZOCOR) 20 MG tablet Take 20 mg by mouth daily.      Historical Provider, MD    Inpatient Medications:      (Not in a hospital admission)  History reviewed. No pertinent family history.   History   Social History  . Marital Status: Married    Spouse Name: N/A    Number of Children: N/A  . Years of Education: N/A   Occupational History  . Not on file.   Social  History Main Topics  . Smoking status: Former Games developer  . Smokeless tobacco: Not on file   Comment: smoked for about 6 years but quit in 1974   . Alcohol Use: Yes     social drinker  . Drug Use: No  . Sexually Active: Not on file   Other Topics Concern  . Not on file   Social History Narrative   Married with 2 children.      Review of Systems: General: negative for chills, fever, night sweats or weight changes.  Cardiovascular: positive for chest pain and palpitations, negative for dyspnea on exertion, edema, orthopnea, paroxysmal nocturnal dyspnea or  shortness of breath Dermatological: negative for rash Respiratory: negative for cough or wheezing Urologic: negative for hematuria Abdominal:  negative for nausea, vomiting, diarrhea, bright red blood per rectum, melena, or hematemesis Neurologic: positive for lightheadedness, negative for visual changes, syncope All other systems reviewed and are otherwise negative except as noted above.  Physical Exam: Blood pressure 144/75, pulse 75, temperature 97.9 F (36.6 C), temperature source Oral, resp. rate 20, SpO2 99.00%.    General: Well developed, well nourished, in no acute distress. Head: Normocephalic, atraumatic, sclera non-icteric, no xanthomas, nares are without discharge.  Neck: Negative for carotid bruits. JVD not elevated. Lungs:  Clear bilaterally to auscultation without wheezes, rales, or rhonchi. Breathing is unlabored. Heart: RRR with S1 S2. No murmurs, rubs, or gallops appreciated. Abdomen: Soft, non-tender, non-distended with normoactive bowel sounds. No hepatomegaly. No rebound/guarding. No obvious abdominal masses. Msk:   Strength and tone appears normal for age. Extremities:  No clubbing, cyanosis or edema.  Distal pedal pulses are 2+ and equal bilaterally. Neuro: Alert and oriented X 3. Moves all extremities spontaneously. Psych:   Responds to questions appropriately with a normal affect.  Labs: Recent Labs  Basename 06/28/12 1318   WBC 6.4   HGB 14.8   HCT 41.8   MCV 88.9   PLT 230   Lab 06/28/12 1318  NA 140  K 3.6  CL 101  CO2 29  BUN 22  CREATININE 1.05  CALCIUM 10.2  PROT 7.1  BILITOT 0.5  ALKPHOS 60  ALT 30  AST 26  AMYLASE --  LIPASE --  GLUCOSE 123*   Radiology/Studies: Dg Chest 2 View  06/28/2012  *RADIOLOGY REPORT*  Clinical Data: Dizziness.  High blood pressure.  CHEST - 2 VIEW  Comparison: 09/08/2009.  Findings: No infiltrate, congestive heart failure or pneumothorax.  Tortuous aorta.  Heart size within normal limits.  Nodular densities  lung bases suggestive of nipple shadows however nipple marker view recommended to exclude underlying nodule.  IMPRESSION: No infiltrate, congestive heart failure or pneumothorax.  Tortuous aorta.  Heart size within normal limits.  Nodular densities lung bases suggestive of nipple shadows however nipple marker view recommended to exclude underlying nodule.   Original Report Authenticated By: Fuller Canada, M.D.     EKG: NSR, 65 bpm, good R wave progression, normal intervals and axis, no ST/T wave changes Telemetry: NSR/sinus bradycardia, no evidence of ventricular ectopy or arrhythmias  ASSESSMENT:   1. Chest pain 2. Presyncope 3. Palpitations 4. HTN 5. HL 6. OSA  DISCUSSION/PLAN:  The patient reports an episode of constant substernal chest tightness at rest today at work. He is a fairly active individual. He denies recent exertional chest pain or DOE. There is no clear temporal pattern. He has well-controlled cardiac risk factors in HTN and HL. He does have a questionable family history of CAD. In the  ED, there is no evidence of objective ischemia. Low suspicion for PE. No acute abnormalities on CXR. He has no prior ischemic work-up. The patient is low pretest probability for cardiac ischemia. Will evaluate coronaries with cardiac CT. Place in observation and cycle cardiac markers. Should CT reveal no evidence of significant CAD, favor discharge with early follow-up. Of note, patient does report increased stress and anxiety at work which may contribute to his symptoms. Regarding increased frequency of palpitations today, will observe for increased PVC burden or other arrhythmias on telemetry. Check TSH and Mg. Continue home medications as he is on a good cardiac regimen.   Signed, R. Hurman Horn, PA-C 06/28/2012, 2:30 PM    Patient examined and chart reviewed.  Atypical symptoms.  Normal ECG  Good candidate for cardiac CT with relatively thin body habitus and low HR  Would likely still  admit for observation given dizzyness for 24 hours of telemetry given PVC history.    Charlton Haws

## 2012-06-28 NOTE — Discharge Summary (Signed)
Discharge Summary   Patient ID: Gary [name],  MRN: [id number], DOB/AGE: [birth year] 62 y.o.  Admit date: 06/28/2012 Discharge date: 06/28/2012  Primary Physician: Ezequiel Kayser, MD Primary Cardiologist: Rollene Rotunda, MD  Discharge Diagnoses Principal Problem:  *Chest pain Active Problems:  DYSLIPIDEMIA  Essential hypertension, benign  Palpitations  Near syncope  Anxiety   Allergies No Known Allergies  Diagnostic Studies/Procedures  PA/LATERAL CHEST X-RAY - 06/28/12  Comparison: 09/08/2009.  Findings: No infiltrate, congestive heart failure or pneumothorax.  Tortuous aorta.  Heart size within normal limits.  Nodular densities lung bases suggestive of nipple shadows however  nipple marker view recommended to exclude underlying nodule.  IMPRESSION:  No infiltrate, congestive heart failure or pneumothorax.  Tortuous aorta.  Heart size within normal limits.  Nodular densities lung bases suggestive of nipple shadows however  nipple marker view recommended to exclude underlying nodule.  CHEST CT - 06/28/12  IMPRESSION:  Mildly dilated ascending thoracic aorta, 4.0 cm proximally.  CARDIAC CT - 06/28/12  Impression:  1) Calcium score 206 73% for age and sex matched controls. ASA  and risk factor modification recommended  2) No hemodynamically significant CAD see body of report  3) Ascending aorta mildly dilated at 4.0 cm  4) No significant non-cardiac findings  History of Present Illness  Gary [name] is a a 61 yo male with the above problem list who presented to [facility] ED today with c/o chest pain and presyncope.   He has followed Dr. Antoine Poche since 2011 for palpitations attributed to PVCs/bigeminy. He was prescribed metoprolol with much improvement. Mg, TSH WNL at that time. BP at target.   He reported experiencing sudden onset lightheadedness at 11 AM while seated at his desk today. No association with sudden standing or standing for long periods. He had  eaten breakfast that morning. He denies LOC. He reported subsequently experiencing 2/10 substernal constant chest tightness without radiation. He noted increased frequency of "skipped beats" today from baseline. He denied shortness of breath, diaphoresis, n/v, PND, DOE or weight increase. No prior episodes of exertional chest pain or lightheadedness. He did note increased stress at work recently. He is fairly active working out 5 days/week, and noted no limitation to this. He did endorse a questionable family history of CAD (father- died in 71s of heart issues). He thus presented to [facility] ED.   Hospital Course   There, EKG revealed no evidence of ischemia or arrhythmia. Trop-I WNL. BMET and CBC unremarkable. CXR without acute cardiopulmonary abnormalities. Given his controlled cardiac history and question of family history of CAD, he was determined to be low pretest probability. He was scheduled for cardiac CT for further noninvasive ischemic evaluation. As above, this revealed a calcium score of 206, no hemodynamically significant CAD, mildly dilated ascending aorta at 4.0 cm and no significant non-cardiac findings. The results were interpreted by Dr. Eden Emms. The recommendation was made for continued ASA use and risk factor modification. The patient was initially placed in observation, but elected to be discharged from the ED with close follow-up. Given his nonischemic cardiac CT, he was deemed stable for discharge.  Chest pain, increased palpitations and lightheadedness were suspected to be anxiety-driven in the context of increased stress at work. Of note, there was no evidence of arrhythmias or increased ventricular ectopy on telemetry in the ED. He will continue all home medications. He will be scheduled for follow-up with Dr. Antoine Poche in 2-4 weeks. He was advised to call the office or present to the  ED once more if his symptoms worsen. This information, including supplemental chest pain material,  has been clearly outlined in the discharge AVS.   Discharge Vitals:  Blood pressure 105/57, pulse 54, temperature 97.9 F (36.6 C), temperature source Oral, resp. rate 16, SpO2 98.00%.   Labs: Recent Labs  Basename 06/28/12 1318   WBC 6.4   HGB 14.8   HCT 41.8   MCV 88.9   PLT 230   Lab 06/28/12 1318  NA 140  K 3.6  CL 101  CO2 29  BUN 22  CREATININE 1.05  CALCIUM 10.2  PROT 7.1  BILITOT 0.5  ALKPHOS 60  ALT 30  AST 26  AMYLASE --  LIPASE --  GLUCOSE 123*   Disposition:   Follow-up Information    Follow up with Rollene Rotunda, MD. (Follow-up in 2-4 weeks. Office has been notified and will call with appointment date & time. )    Contact information:   [address] [place] [address] [phone]          Discharge Medications:    Medication List     As of 06/28/2012  5:22 PM    CONTINUE taking these medications         aspirin 81 MG tablet      doxycycline 100 MG tablet   Commonly known as: VIBRA-TABS      FINACEA 15 % cream   Generic drug: Azelaic Acid      Fish Oil Oil      hydrochlorothiazide 12.5 MG capsule   Commonly known as: MICROZIDE      lisinopril 40 MG tablet   Commonly known as: PRINIVIL,ZESTRIL      metoprolol tartrate 25 MG tablet   Commonly known as: LOPRESSOR      ZOCOR 20 MG tablet   Generic drug: simvastatin       Outstanding Labs/Studies: None  Duration of Discharge Encounter: Greater than 30 minutes including physician time.  Signed, R. Hurman Horn, PA-C 06/28/2012, 5:22 PM    See ER note.  Cardiac CT with calcium score 206 but no hemodynamically significant CAD Outpatient F/U Dr Antoine Poche  CRF modification  Charlton Haws

## 2012-06-28 NOTE — ED Provider Notes (Signed)
History     CSN: [id number]  Arrival date & time 06/28/12  1307   First MD Initiated Contact with Patient 06/28/12 1339      Chief Complaint  Patient presents with  . Chest Pain  . 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  He felt light-headed, as though to faint.  He has had no prior similar episodes.  He has a history of hypertension and high cholesterol, has seen Dr. Antoine Poche of [facility] in the past.  The chest pain is felt in the left anterior chest, does not radiate.  He rated the pain at a 2 when I saw him.  Patient is a 61 y.o. male presenting with chest pain. The history is provided by the patient.  Chest Pain The chest pain began less than 1 hour ago. Chest pain occurs constantly. The chest pain is improving. Associated with: Nothing. At its most intense, the pain is at 2/10. The pain is currently at 2/10. The severity of the pain is mild. The quality of the pain is described as tightness (A squeezing sensation in his chest.). The pain does not radiate. Exacerbated by: Nothing. Pertinent negatives for primary symptoms include no fever, no shortness of breath, no cough, no palpitations and no nausea.  Associated symptoms include near-syncope. He tried nothing for the symptoms. Risk factors include being elderly, sedentary lifestyle and male gender.  His past medical history is significant for hyperlipidemia and hypertension. Past medical history comments: Palpitations     Past Medical History  Diagnosis Date  . Chronic knee pain     bilateral   . Sleep apnea     treated with a mouthguard   . Hypertension   . Hyperlipidemia   . Palpitations     Attributed to PVCs and bigeminy on 2012 on 24-hr Holter monitor    Past Surgical History  Procedure Date  . Anterior cruciate ligament  repair     Family History  Problem Relation Age of Onset  . Heart disease Father 57    Deceased- question of cardiac etiology    History  Substance Use Topics  . Smoking status: Former Games developer  . Smokeless tobacco: Not on file   Comment: smoked for about 6 years but quit in 1974   . Alcohol Use: Yes     social drinker      Review of Systems  Constitutional: Negative.  Negative for fever and chills.  HENT: Negative.   Eyes: Negative.   Respiratory: Negative for cough and shortness of breath.   Cardiovascular: Positive for chest pain and near-syncope. Negative for palpitations.  Gastrointestinal: Negative for nausea.  Genitourinary:       He experiences some nocturia   Musculoskeletal: Negative.   Skin: Negative.   Neurological: Negative.   Psychiatric/Behavioral: Negative.     Allergies  Review of patient's allergies indicates no known allergies.  Home Medications   Current Outpatient Rx  Name Route Sig Dispense Refill  . ASPIRIN 81 MG PO TABS Oral Take 81 mg by mouth daily.      . AZELAIC ACID 15 % EX GEL Topical Apply 1 application topically daily.     Marland Kitchen DOXYCYCLINE HYCLATE 100 MG PO TABS Oral Take 100 mg by mouth daily.      Marland Kitchen FISH OIL OIL Oral Take 1 capsule by mouth daily.      Marland Kitchen  HYDROCHLOROTHIAZIDE 12.5 MG PO CAPS Oral Take 12.5 mg by mouth daily.      Marland Kitchen LISINOPRIL 40 MG PO TABS Oral Take 40 mg by mouth daily.      Marland Kitchen METOPROLOL TARTRATE 25 MG PO TABS Oral Take 25 mg by mouth 2 (two) times daily.    Marland Kitchen SIMVASTATIN 20 MG PO TABS Oral Take 20 mg by mouth daily.        BP 128/76  Pulse 54  Temp 97.9 F (36.6 C) (Oral)  Resp 16  SpO2 100%  Physical Exam  Constitutional: He is oriented to person, place, and time. He appears well-developed and well-nourished. No distress.  HENT:  Head: Normocephalic and atraumatic.  Right Ear: External ear normal.  Mouth/Throat: Oropharynx is clear and moist.  Eyes: Conjunctivae normal and EOM are normal. Pupils are  equal, round, and reactive to light.  Neck: Normal range of motion. Neck supple.  Cardiovascular: Normal rate, regular rhythm and normal heart sounds.   Pulmonary/Chest: Effort normal and breath sounds normal.  Abdominal: Soft. Bowel sounds are normal.  Musculoskeletal: Normal range of motion. He exhibits no edema and no tenderness.  Neurological: He is alert and oriented to person, place, and time.       No sensory or motor deficit.  Skin: Skin is warm.  Psychiatric: He has a normal mood and affect. His behavior is normal.    ED Course  Procedures (including critical care time)  Labs Reviewed  CBC WITH DIFFERENTIAL - Abnormal; Notable for the following:    Eosinophils Relative 8 (*)     All other components within normal limits  COMPREHENSIVE METABOLIC PANEL - Abnormal; Notable for the following:    Glucose, Bld 123 (*)     GFR calc non Af Amer 74 (*)     GFR calc Af Amer 86 (*)     All other components within normal limits  POCT I-STAT TROPONIN I  URINALYSIS, ROUTINE W REFLEX MICROSCOPIC  MAGNESIUM  TSH   Dg Chest 2 View  06/28/2012  *RADIOLOGY REPORT*  Clinical Data: Dizziness.  High blood pressure.  CHEST - 2 VIEW  Comparison: 09/08/2009.  Findings: No infiltrate, congestive heart failure or pneumothorax.  Tortuous aorta.  Heart size within normal limits.  Nodular densities lung bases suggestive of nipple shadows however nipple marker view recommended to exclude underlying nodule.  IMPRESSION: No infiltrate, congestive heart failure or pneumothorax.  Tortuous aorta.  Heart size within normal limits.  Nodular densities lung bases suggestive of nipple shadows however nipple marker view recommended to exclude underlying nodule.   Original Report Authenticated By: Fuller Canada, M.D.    4:32 PM Lab tests negative.  Pt seen by Dr. Eden Emms, who had pt have Cardiac CT.  Waiting for disposition.  1. Chest pain        Carleene Cooper III, MD 06/28/12 [phone]

## 2012-07-14 ENCOUNTER — Ambulatory Visit (INDEPENDENT_AMBULATORY_CARE_PROVIDER_SITE_OTHER): Payer: 59 | Admitting: Cardiology

## 2012-07-14 ENCOUNTER — Encounter: Payer: Self-pay | Admitting: Cardiology

## 2012-07-14 VITALS — BP 115/60 | HR 68 | Ht 71.0 in | Wt 170.8 lb

## 2012-07-14 DIAGNOSIS — R002 Palpitations: Secondary | ICD-10-CM

## 2012-07-14 DIAGNOSIS — I10 Essential (primary) hypertension: Secondary | ICD-10-CM

## 2012-07-14 NOTE — Progress Notes (Signed)
HPI The patient presents for followup after recent ER visit. He was at work about to have a meeting when he noticed some increased palpitations and some squeezing in his chest. He developed tightness. The whole episode lasted about 3 minutes. In the emergency room he was not to have any acute EKG changes or enzyme changes. However, he did have a CT with a calcium score 200. He was found to have 30% LAD stenosis, 30% diagonal stenosis and 30% PDA stenosis. He had a mildly elevated descending aorta.  Since that procedure he has had no further symptoms. He denies any chest pressure, neck or arm discomfort. He has had no shortness of breath, PND or orthopnea. He has occasional palpitations. These are controlled with beta blockers. He is able to exercise and does so routinely without bringing on any symptoms.  No Known Allergies  Current Outpatient Prescriptions  Medication Sig Dispense Refill  . aspirin 81 MG tablet Take 81 mg by mouth daily.        . Azelaic Acid (FINACEA) 15 % cream Apply 1 application topically daily.       Marland Kitchen doxycycline (VIBRA-TABS) 100 MG tablet Take 100 mg by mouth daily.        . Fish Oil OIL Take 1 capsule by mouth daily.        . hydrochlorothiazide (,MICROZIDE/HYDRODIURIL,) 12.5 MG capsule Take 12.5 mg by mouth daily.        Marland Kitchen lisinopril (PRINIVIL,ZESTRIL) 40 MG tablet Take 40 mg by mouth daily.        . metoprolol tartrate (LOPRESSOR) 25 MG tablet Take 25 mg by mouth 2 (two) times daily.      . rosuvastatin (CRESTOR) 10 MG tablet Take 20 mg by mouth daily.        Past Medical History  Diagnosis Date  . Chronic knee pain     bilateral   . Sleep apnea     treated with a mouthguard   . Hypertension   . Hyperlipidemia   . Palpitations     Attributed to PVCs and bigeminy on 2012 on 24-hr Holter monitor    Past Surgical History  Procedure Date  . Anterior cruciate ligament repair     ROS:  As stated in the HPI and negative for all other systems.  PHYSICAL  EXAM BP 115/60  Pulse 68  Ht 5\' 11"  (1.803 m)  Wt 170 lb 12.8 oz (77.474 kg)  BMI 23.82 kg/m2 GENERAL:  Well appearing HEENT:  Pupils equal round and reactive, fundi not visualized, oral mucosa unremarkable NECK:  No jugular venous distention, waveform within normal limits, carotid upstroke brisk and symmetric, no bruits, no thyromegaly LYMPHATICS:  No cervical, inguinal adenopathy LUNGS:  Clear to auscultation bilaterally BACK:  No CVA tenderness CHEST:  Unremarkable HEART:  PMI not displaced or sustained,S1 and S2 within normal limits, no S3, no S4, no clicks, no rubs, no murmurs ABD:  Flat, positive bowel sounds normal in frequency in pitch, no bruits, no rebound, no guarding, no midline pulsatile mass, no hepatomegaly, no splenomegaly EXT:  2 plus pulses throughout, no edema, no cyanosis no clubbing SKIN:  No rashes no nodules NEURO:  Cranial nerves II through XII grossly intact, motor grossly intact throughout PSYCH:  Cognitively intact, oriented to person place and time   EKG:  Sinus rhythm, rate 53, axis within normal limits, intervals within normal limits, no acute ST-T wave changes.   ASSESSMENT AND PLAN  CAD - The patient has been  found to have a moderately elevated calcium score with nonobstructive coronary disease as described above. I would like to bring him back for a functional study and do an exercise treadmill test. Given this disease we will pursue aggressive risk reduction.  PALPITATIONS -  He tolerates the beta blocker. No further testing is indicated. No change in therapy is indicated.  ESSENTIAL HYPERTENSION, BENIGN -  The blood pressure is at target. No change in medications is indicated. We will continue with therapeutic lifestyle changes (TLC).   DYSLIPIDEMIA - His LDL was recently 106 with an HDL of 45. He was switched to Crestor and the plan is for a Lipomed profile.  I agree with this aggressive approach.

## 2012-07-14 NOTE — Patient Instructions (Addendum)
The current medical regimen is effective;  continue present plan and medications.  Your physician has requested that you have an exercise tolerance test. For further information please visit [email or web address]. Please also follow instruction sheet, as given.   

## 2012-07-28 ENCOUNTER — Encounter (HOSPITAL_COMMUNITY): Payer: 59

## 2012-07-28 ENCOUNTER — Ambulatory Visit (INDEPENDENT_AMBULATORY_CARE_PROVIDER_SITE_OTHER): Payer: 59 | Admitting: Cardiology

## 2012-07-28 ENCOUNTER — Encounter: Payer: 59 | Admitting: Cardiology

## 2012-07-28 DIAGNOSIS — R079 Chest pain, unspecified: Secondary | ICD-10-CM

## 2012-07-28 NOTE — Progress Notes (Signed)
Exercise Treadmill Test  Pre-Exercise Testing Evaluation Rhythm: sinus bradycardia  Rate: 58   PR:  .17 QRS:  .09  QT:  .42 QTc: .42           Test  Exercise Tolerance Test Ordering MD: Angelina Sheriff, MD  Interpreting MD: Angelina Sheriff, MD  Unique Test No: 1  Treadmill:  1  Indication for ETT: chest pain - rule out ischemia  Contraindication to ETT: No   Stress Modality: exercise - treadmill  Cardiac Imaging Performed: non   Protocol: standard Bruce - maximal  Max BP:  158/75  Max MPHR (bpm):  158 85% MPR (bpm):  134  MPHR obtained (bpm):  160 % MPHR obtained:  101  Reached 85% MPHR (min:sec):  4:15 Total Exercise Time (min-sec):  9:00  Workload in METS:  10.1 Borg Scale: 15  Reason ETT Terminated:  desired heart rate attained    ST Segment Analysis At Rest: normal ST segments - no evidence of significant ST depression With Exercise: no evidence of significant ST depression  Other Information Arrhythmia:  No Angina during ETT:  absent (0) Quality of ETT:  diagnostic  ETT Interpretation:  normal - no evidence of ischemia by ST analysis  Comments: The patient had an excellent exercise tolerance.  There was no chest pain.  There was an appropriate level of dyspnea.  There were no arrhythmias, a normal heart rate response and normal BP response.  There were no ischemic ST T wave changes and a normal heart rate recovery.  Recommendations: Negative adequate ETT.  No further testing is indicated.  Based on the above I gave the patient a prescription for exercise.

## 2012-08-22 ENCOUNTER — Other Ambulatory Visit: Payer: Self-pay | Admitting: Cardiology

## 2012-11-05 ENCOUNTER — Other Ambulatory Visit: Payer: Self-pay

## 2013-05-02 ENCOUNTER — Telehealth: Payer: Self-pay | Admitting: Cardiology

## 2013-05-02 DIAGNOSIS — R002 Palpitations: Secondary | ICD-10-CM

## 2013-05-02 DIAGNOSIS — I499 Cardiac arrhythmia, unspecified: Secondary | ICD-10-CM

## 2013-05-02 DIAGNOSIS — R55 Syncope and collapse: Secondary | ICD-10-CM

## 2013-05-02 NOTE — Telephone Encounter (Signed)
New prob  Pt wants to know if he needs to have another stress test this year. And if he does he wants to know can it be on Nov 13.

## 2013-05-02 NOTE — Telephone Encounter (Signed)
I think we should do a POET.  We can try to arrange it for this day.

## 2013-05-02 NOTE — Telephone Encounter (Signed)
Will forward to Dr Hochrein for review  

## 2013-06-06 ENCOUNTER — Other Ambulatory Visit: Payer: Self-pay

## 2013-06-06 MED ORDER — METOPROLOL TARTRATE 25 MG PO TABS: 25.0000 mg | ORAL_TABLET | Freq: Two times a day (BID) | ORAL | Status: DC

## 2013-06-09 ENCOUNTER — Ambulatory Visit (INDEPENDENT_AMBULATORY_CARE_PROVIDER_SITE_OTHER): Payer: Commercial Managed Care - PPO | Admitting: Emergency Medicine

## 2013-06-09 ENCOUNTER — Other Ambulatory Visit: Payer: Self-pay | Admitting: Emergency Medicine

## 2013-06-09 ENCOUNTER — Ambulatory Visit: Payer: Commercial Managed Care - PPO

## 2013-06-09 ENCOUNTER — Telehealth: Payer: Self-pay | Admitting: Emergency Medicine

## 2013-06-09 VITALS — BP 124/70 | HR 61 | Temp 97.9°F | Resp 18 | Wt 163.0 lb

## 2013-06-09 DIAGNOSIS — M25579 Pain in unspecified ankle and joints of unspecified foot: Secondary | ICD-10-CM

## 2013-06-09 DIAGNOSIS — M79672 Pain in left foot: Secondary | ICD-10-CM

## 2013-06-09 DIAGNOSIS — M79675 Pain in left toe(s): Secondary | ICD-10-CM

## 2013-06-09 DIAGNOSIS — M79609 Pain in unspecified limb: Secondary | ICD-10-CM

## 2013-06-09 LAB — POCT CBC
Lymph, poc: 1.7 (ref 0.6–3.4)
MCH, POC: 30.9 pg (ref 27–31.2)
MCHC: 32 g/dL (ref 31.8–35.4)
MCV: 96.7 fL (ref 80–97)
MID (cbc): 0.6 (ref 0–0.9)
POC LYMPH PERCENT: 28.3 %L (ref 10–50)
Platelet Count, POC: 294 10*3/uL (ref 142–424)
RBC: 4.59 M/uL — AB (ref 4.69–6.13)
WBC: 5.9 10*3/uL (ref 4.6–10.2)

## 2013-06-09 LAB — BASIC METABOLIC PANEL
BUN: 17 mg/dL (ref 6–23)
Chloride: 105 mEq/L (ref 96–112)
Creat: 0.97 mg/dL (ref 0.50–1.35)

## 2013-06-09 MED ORDER — HYDROCODONE-ACETAMINOPHEN 5-325 MG PO TABS: 1.0000 | ORAL_TABLET | Freq: Four times a day (QID) | ORAL | Status: AC | PRN

## 2013-06-09 MED ORDER — PREDNISONE 10 MG PO TABS: ORAL_TABLET | ORAL | Status: AC

## 2013-06-09 NOTE — Progress Notes (Signed)
  Subjective:    Patient ID: Gary [name], male    DOB: 1951/03/28, 62 y.o.   MRN: [id number]  HPI patient suffered a tear to the medial collateral ligament 2 weeks ago. He's been in a splint since that time. His knees seems to be improving. [place] He has subsequently developed severe pain in his left great toe and some swelling across the dorsum of the foot. He has been on a diuretic. He has been drinking wine at night while on vacation. He is also enjoys seafood. He now resides at the beach he needs to [facility]. He has retired from his job as a Teacher, early years/pre at [employer]    Review of Systems     Objective:   Physical Exam patient is alert and cooperative and in no distress. Examination of the left ankle were and foot reveals significant tenderness over the MTP joint left great toe. There is exquisite pain with any movement. There is also significant swelling. There is no calf tenderness there is no popliteal tenderness .  [facility] reading (PRIMARY) by  Dr. Cleta Alberts there are degenerative changes at the first MTP joint there is also calcification  present adjacent to this joint  Results for orders placed in visit on 06/09/13  POCT CBC      Result Value Range   WBC 5.9  4.6 - 10.2 K/uL   Lymph, poc 1.7  0.6 - 3.4   POC LYMPH PERCENT 28.3  10 - 50 %L   MID (cbc) 0.6  0 - 0.9   POC MID % 10.1  0 - 12 %M   POC Granulocyte 3.6  2 - 6.9   Granulocyte percent 61.6  37 - 80 %G   RBC 4.59 (*) 4.69 - 6.13 M/uL   Hemoglobin 14.2  14.1 - 18.1 g/dL   HCT, POC 40.9  81.1 - 53.7 %   MCV 96.7  80 - 97 fL   MCH, POC 30.9  27 - 31.2 pg   MCHC 32.0  31.8 - 35.4 g/dL   RDW, POC 91.4     Platelet Count, POC 294  142 - 424 K/uL   MPV 8.5  0 - 99.8 fL        Assessment & Plan:  I suspect this is gout. We'll treat with prednisone taper along with hydrocodone for pain. He is to stop his HCTZ. He did not showing signs on physical exam of a clot. I advised him to stop wearing the Ace  wrap he has been wearing around his left knee and to wear the brace .

## 2013-06-09 NOTE — Patient Instructions (Signed)
Gout  Gout is an inflammatory condition (arthritis) caused by a buildup of uric acid crystals in the joints. Uric acid is a chemical that is normally present in the blood. Under some circumstances, uric acid can form into crystals in your joints. This causes joint redness, soreness, and swelling (inflammation). Repeat attacks are common. Over time, uric acid crystals can form into masses (tophi) near a joint, causing disfigurement. Gout is treatable and often preventable.  CAUSES   The disease begins with elevated levels of uric acid in the blood. Uric acid is produced by your body when it breaks down a naturally found substance called purines. This also happens when you eat certain foods such as meats and fish. Causes of an elevated uric acid level include:   Being passed down from parent to child (heredity).   Diseases that cause increased uric acid production (obesity, psoriasis, some cancers).   Excessive alcohol use.   Diet, especially diets rich in meat and seafood.   Medicines, including certain cancer-fighting drugs (chemotherapy), diuretics, and aspirin.   Chronic kidney disease. The kidneys are no longer able to remove uric acid well.   Problems with metabolism.  Conditions strongly associated with gout include:   Obesity.   High blood pressure.   High cholesterol.   Diabetes.  Not everyone with elevated uric acid levels gets gout. It is not understood why some people get gout and others do not. Surgery, joint injury, and eating too much of certain foods are some of the factors that can lead to gout.  SYMPTOMS    An attack of gout comes on quickly. It causes intense pain with redness, swelling, and warmth in a joint.   Fever can occur.   Often, only one joint is involved. Certain joints are more commonly involved:   Base of the big toe.   Knee.   Ankle.   Wrist.   Finger.  Without treatment, an attack usually goes away in a few days to weeks. Between attacks, you usually will not have  symptoms, which is different from many other forms of arthritis.  DIAGNOSIS   Your caregiver will suspect gout based on your symptoms and exam. Removal of fluid from the joint (arthrocentesis) is done to check for uric acid crystals. Your caregiver will give you a medicine that numbs the area (local anesthetic) and use a needle to remove joint fluid for exam. Gout is confirmed when uric acid crystals are seen in joint fluid, using a special microscope. Sometimes, blood, urine, and X-ray tests are also used.  TREATMENT   There are 2 phases to gout treatment: treating the sudden onset (acute) attack and preventing attacks (prophylaxis).  Treatment of an Acute Attack   Medicines are used. These include anti-inflammatory medicines or steroid medicines.   An injection of steroid medicine into the affected joint is sometimes necessary.   The painful joint is rested. Movement can worsen the arthritis.   You may use warm or cold treatments on painful joints, depending which works best for you.   Discuss the use of coffee, vitamin C, or cherries with your caregiver. These may be helpful treatment options.  Treatment to Prevent Attacks  After the acute attack subsides, your caregiver may advise prophylactic medicine. These medicines either help your kidneys eliminate uric acid from your body or decrease your uric acid production. You may need to stay on these medicines for a very long time.  The early phase of treatment with prophylactic medicine can be associated   with an increase in acute gout attacks. For this reason, during the first few months of treatment, your caregiver may also advise you to take medicines usually used for acute gout treatment. Be sure you understand your caregiver's directions.  You should also discuss dietary treatment with your caregiver. Certain foods such as meats and fish can increase uric acid levels. Other foods such as dairy can decrease levels. Your caregiver can give you a list of foods  to avoid.  HOME CARE INSTRUCTIONS    Do not take aspirin to relieve pain. This raises uric acid levels.   Only take over-the-counter or prescription medicines for pain, discomfort, or fever as directed by your caregiver.   Rest the joint as much as possible. When in bed, keep sheets and blankets off painful areas.   Keep the affected joint raised (elevated).   Use crutches if the painful joint is in your leg.   Drink enough water and fluids to keep your urine clear or pale yellow. This helps your body get rid of uric acid. Do not drink alcoholic beverages. They slow the passage of uric acid.   Follow your caregiver's dietary instructions. Pay careful attention to the amount of protein you eat. Your daily diet should emphasize fruits, vegetables, whole grains, and fat-free or low-fat milk products.   Maintain a healthy body weight.  SEEK MEDICAL CARE IF:    You have an oral temperature above 102 F (38.9 C).   You develop diarrhea, vomiting, or any side effects from medicines.   You do not feel better in 24 hours, or you are getting worse.  SEEK IMMEDIATE MEDICAL CARE IF:    Your joint becomes suddenly more tender and you have:   Chills.   An oral temperature above 102 F (38.9 C), not controlled by medicine.  MAKE SURE YOU:    Understand these instructions.   Will watch your condition.   Will get help right away if you are not doing well or get worse.  Document Released: 09/04/2000 Document Revised: 11/30/2011 Document Reviewed: 12/16/2009  Exit[employer] Patient Information 2014 [employer].

## 2013-06-10 ENCOUNTER — Telehealth: Payer: Self-pay | Admitting: Emergency Medicine

## 2013-06-10 ENCOUNTER — Ambulatory Visit (HOSPITAL_COMMUNITY)
Admission: RE | Admit: 2013-06-10 | Discharge: 2013-06-10 | Disposition: A | Discharge status: Home / Self Care | Payer: 59 | Source: Ambulatory Visit | Attending: Emergency Medicine | Admitting: Emergency Medicine

## 2013-06-10 DIAGNOSIS — R937 Abnormal findings on diagnostic imaging of other parts of musculoskeletal system: Secondary | ICD-10-CM | POA: Insufficient documentation

## 2013-06-10 DIAGNOSIS — M79609 Pain in unspecified limb: Secondary | ICD-10-CM | POA: Insufficient documentation

## 2013-06-10 NOTE — Telephone Encounter (Signed)
I called and discussed abnormal findings on his plain films with Gary [name]. We'll proceed with CT of the foot to evaluate the abnormal area seen.

## 2013-07-27 ENCOUNTER — Other Ambulatory Visit: Payer: Self-pay

## 2013-08-03 ENCOUNTER — Other Ambulatory Visit: Payer: Self-pay | Admitting: *Deleted

## 2013-08-03 ENCOUNTER — Ambulatory Visit (INDEPENDENT_AMBULATORY_CARE_PROVIDER_SITE_OTHER): Payer: 59 | Admitting: Cardiology

## 2013-08-03 DIAGNOSIS — R55 Syncope and collapse: Secondary | ICD-10-CM

## 2013-08-03 DIAGNOSIS — I499 Cardiac arrhythmia, unspecified: Secondary | ICD-10-CM

## 2013-08-03 DIAGNOSIS — I10 Essential (primary) hypertension: Secondary | ICD-10-CM

## 2013-08-03 DIAGNOSIS — R002 Palpitations: Secondary | ICD-10-CM

## 2013-08-03 MED ORDER — METOPROLOL TARTRATE 25 MG PO TABS: 25.0000 mg | ORAL_TABLET | Freq: Two times a day (BID) | ORAL | Status: AC

## 2013-08-03 NOTE — Progress Notes (Signed)
   Exercise Treadmill Test  Pre-Exercise Testing Evaluation Rhythm: sinus bradycardia  Rate: 57 bpm     Test  Exercise Tolerance Test Ordering MD: Angelina Sheriff, MD  Interpreting MD: Angelina Sheriff, MD  Unique Test No: 1  Treadmill:  1  Indication for ETT: palps, syncope  Contraindication to ETT: No   Stress Modality: exercise - treadmill  Cardiac Imaging Performed: non   Protocol: standard Bruce - maximal  Max BP:  174/83  Max MPHR (bpm):  157 85% MPR (bpm):  133  MPHR obtained (bpm):  148 % MPHR obtained:  94  Reached 85% MPHR (min:sec):  7:07 Total Exercise Time (min-sec):  9:30  Workload in METS:  10.9 Borg Scale: 15  Reason ETT Terminated:  desired heart rate attained    ST Segment Analysis At Rest: normal ST segments - no evidence of significant ST depression With Exercise: no evidence of significant ST depression  Other Information Arrhythmia:  No Angina during ETT:  absent (0) Quality of ETT:  diagnostic  ETT Interpretation:  normal - no evidence of ischemia by ST analysis  Comments: The patient had an excellent exercise tolerance.  There was no chest pain.  There was an appropriate level of dyspnea.  There were no arrhythmias, a normal heart rate response and normal BP response.  There were no ischemic ST T wave changes and a normal heart rate recovery.  Recommendations: Negative adequate ETT.  No further testing is indicated.  Based on the above I gave the patient a prescription for exercise.

## 2013-09-04 ENCOUNTER — Other Ambulatory Visit: Payer: Self-pay | Admitting: Cardiology

## 2013-11-02 NOTE — Telephone Encounter (Signed)
A user error has taken place: encounter opened in error, closed for administrative reasons.

## 2013-12-04 ENCOUNTER — Other Ambulatory Visit: Payer: Self-pay | Admitting: Cardiology

## 2014-06-11 IMAGING — CT CT FOOT*L* W/O CM
3 of 5 series · 9 of 36 positions shown, 10 images · non-contrast
Comparison: Radiographs dated 06/09/2013

CLINICAL DATA: Left foot pain for 4 days.  Abnormal radiographs on
06/09/2013

CT OF THE LEFT FOOT WITHOUT CONTRAST
TECHNIQUE: Multidetector CT imaging was performed according to the
standard protocol. Multiplanar CT image reconstructions were also
generated.

[Series 3: left foot · axial · 0.42mm/px · z∈[-429,-429]mm · 1 of 85 slices shown, 2 images]
[im 43/85  soft-tissue]
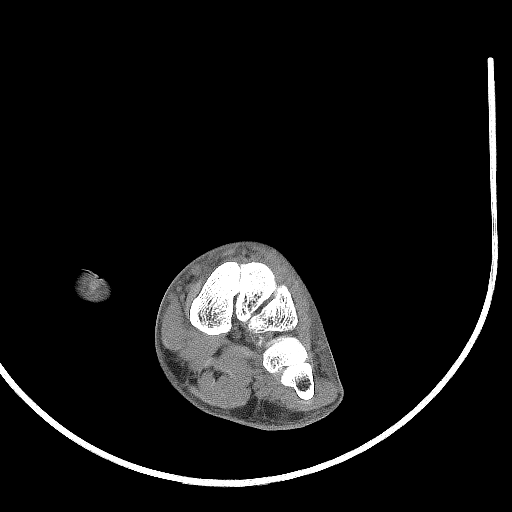
[im 43/85  bone]
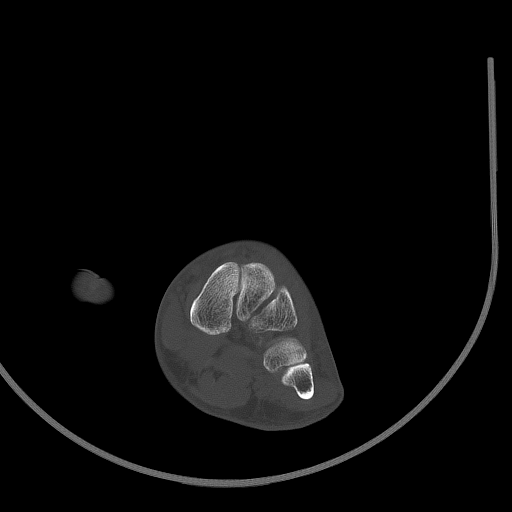

[Series 6: sagittal bone · sagittal · 0.26mm/px · 5 of 50 slices shown]
[im 9/50  bone]
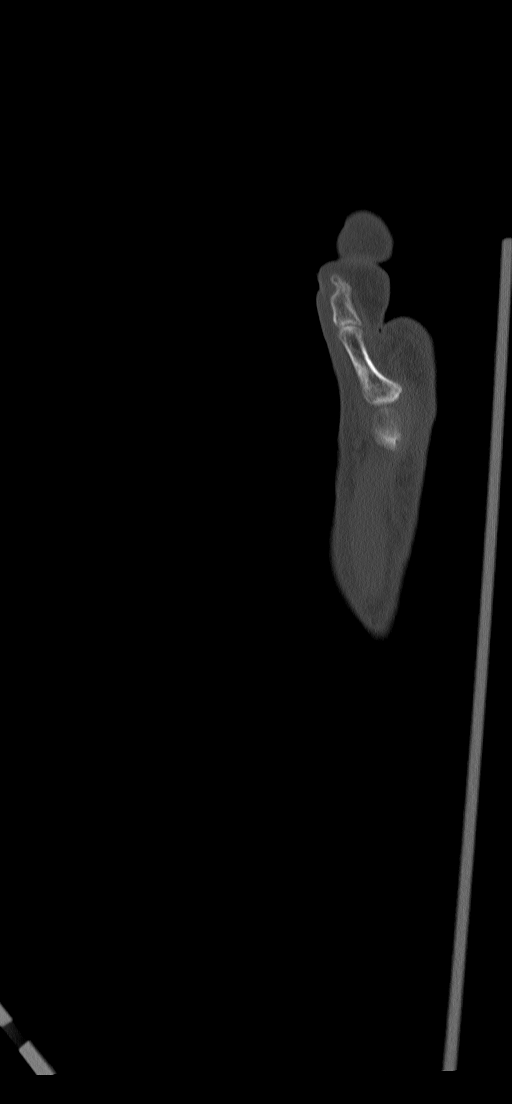
[im 17/50  bone]
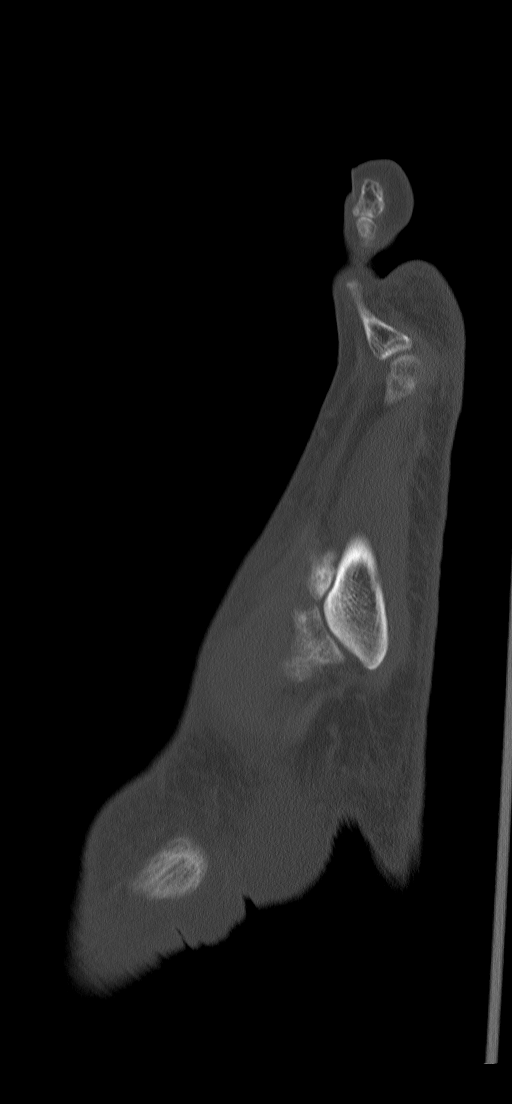
[im 25/50  bone]
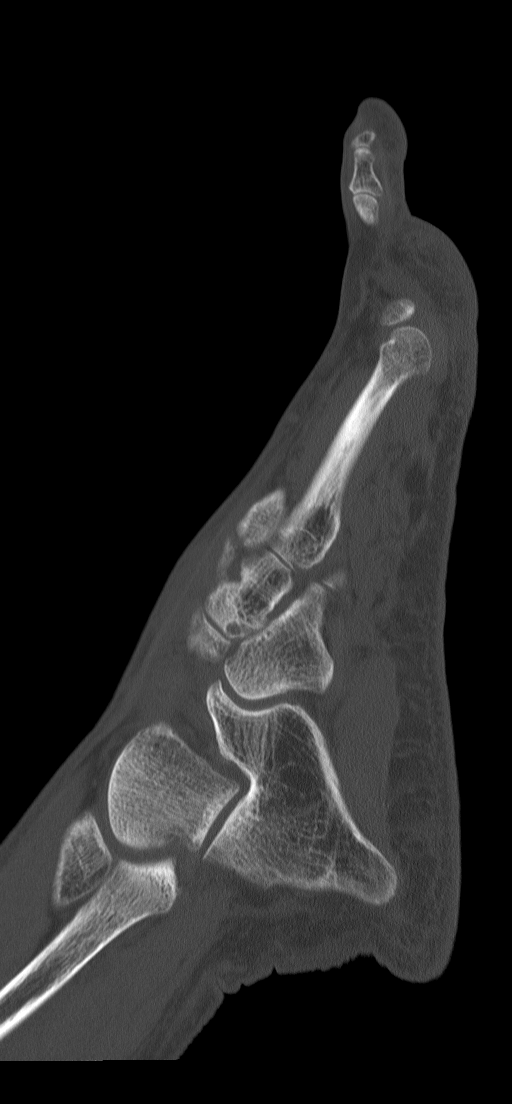
[im 33/50  bone]
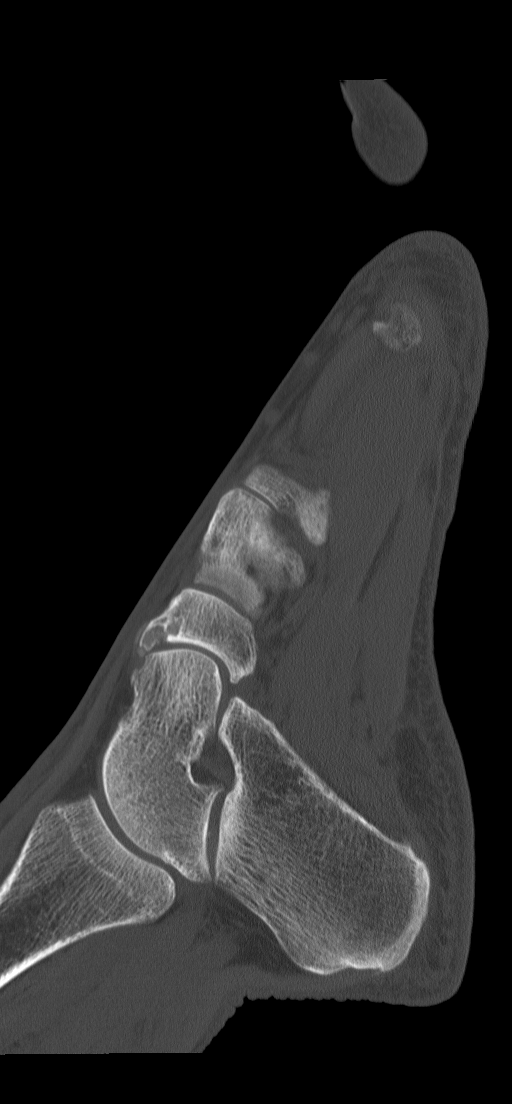
[im 41/50  bone]
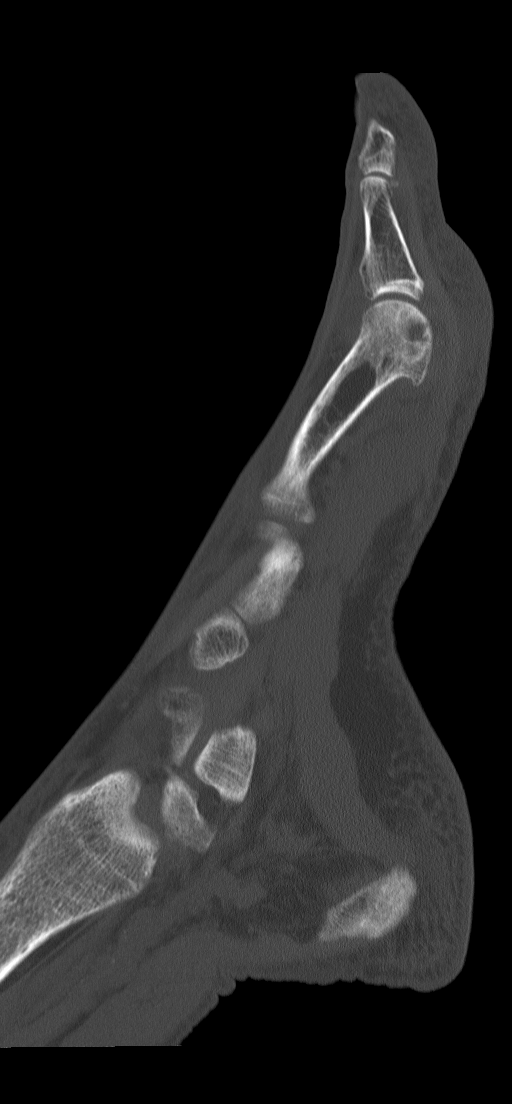

[Series 7: coronal soft tissue · coronal · 0.21mm/px · 3 of 62 slices shown]
[im 13/62  bone]
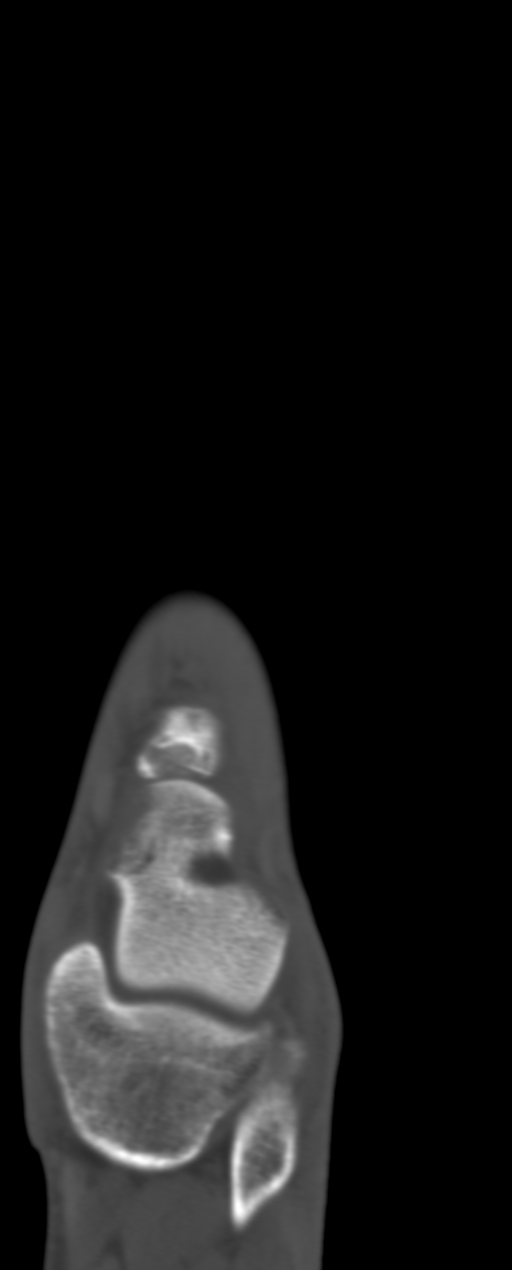
[im 25/62  bone]
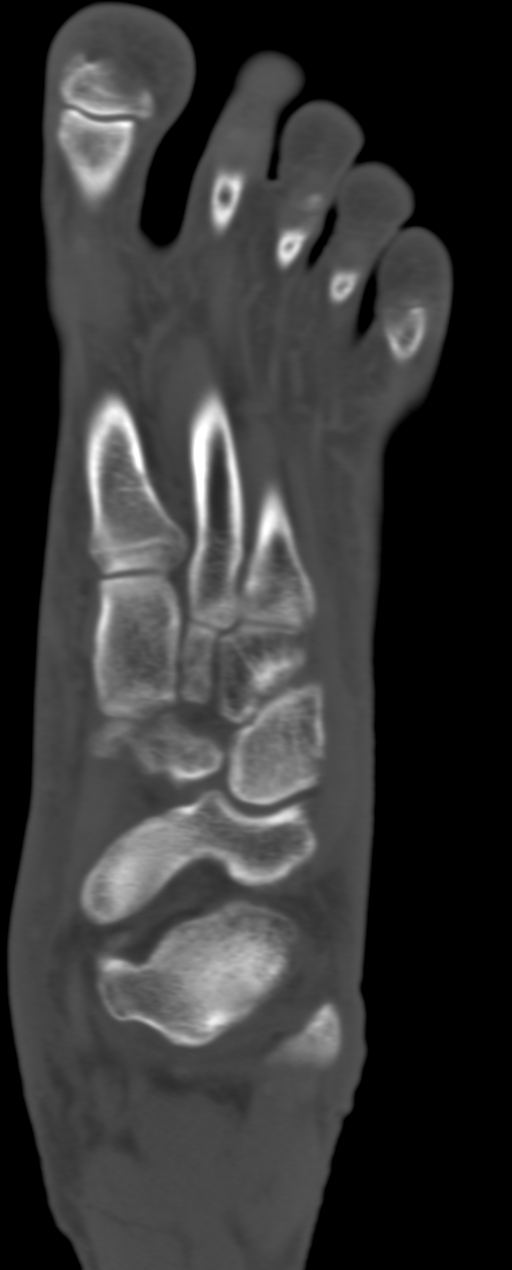
[im 37/62  bone]
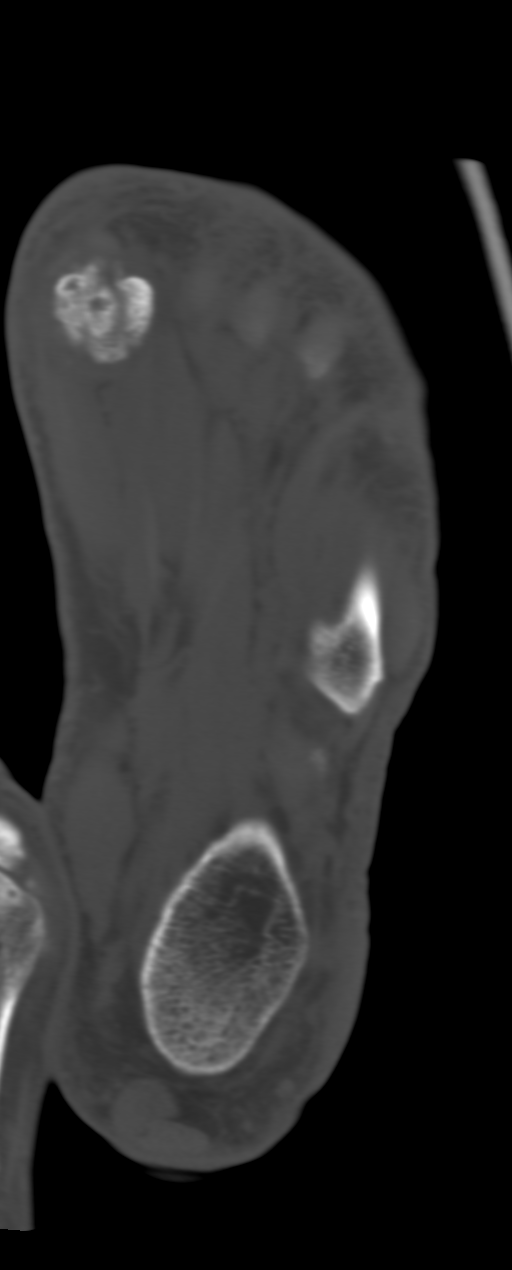

[9 of 36 positions shown; findings below may reference images not displayed]

FINDINGS: There is a 12 mm erosion with adjacent to the osteophyte
formation on the dorsal aspect of the navicular.  The patient also
has subchondral cysts in the head of the first metatarsal and in
the base of the proximal phalangeal bone of the great toe.  There
are smaller subchondral cysts in the distal calcaneus, distal
cuboid, and SUCAJ proximal lateral cuneiform.

The patient has particular amorphous calcifications at the first
metatarsal phalangeal joint as well as arthritic changes of the
sesamoids at the head of the first metatarsal.

The other osseous structures appear normal.
IMPRESSION: Multiple findings suggestive of gout.  Calcium pyrophosphate
deposition disease (CPPD) could also create these findings.
